# Patient Record
Sex: Male | Born: 1975 | Race: White | Hispanic: No | Marital: Married | Smoking: Former smoker
Health system: Southern US, Community
[De-identification: ages and names within clinical notes are randomized; demographics above are authoritative.]

## PROBLEM LIST (undated history)

## (undated) DIAGNOSIS — K603 Anal fistula, unspecified: Secondary | ICD-10-CM

## (undated) DIAGNOSIS — R197 Diarrhea, unspecified: Secondary | ICD-10-CM

## (undated) DIAGNOSIS — J45909 Unspecified asthma, uncomplicated: Secondary | ICD-10-CM

## (undated) DIAGNOSIS — K6289 Other specified diseases of anus and rectum: Secondary | ICD-10-CM

## (undated) HISTORY — DX: Unspecified asthma, uncomplicated: J45.909

## (undated) HISTORY — PX: WISDOM TOOTH EXTRACTION: SHX21

## (undated) HISTORY — DX: Other specified diseases of anus and rectum: K62.89

---

## 2008-12-29 ENCOUNTER — Emergency Department (HOSPITAL_BASED_OUTPATIENT_CLINIC_OR_DEPARTMENT_OTHER): Admission: EM | Admit: 2008-12-29 | Discharge: 2008-12-29 | Payer: Self-pay | Admitting: Emergency Medicine

## 2008-12-29 ENCOUNTER — Ambulatory Visit: Payer: Self-pay | Admitting: Radiology

## 2011-02-27 ENCOUNTER — Encounter (INDEPENDENT_AMBULATORY_CARE_PROVIDER_SITE_OTHER): Payer: Self-pay | Admitting: General Surgery

## 2011-02-27 ENCOUNTER — Ambulatory Visit (INDEPENDENT_AMBULATORY_CARE_PROVIDER_SITE_OTHER): Payer: Managed Care, Other (non HMO) | Admitting: General Surgery

## 2011-02-27 VITALS — BP 108/76 | HR 58 | Temp 98.0°F | Ht 71.0 in | Wt 180.2 lb

## 2011-02-27 DIAGNOSIS — K612 Anorectal abscess: Secondary | ICD-10-CM

## 2011-02-27 DIAGNOSIS — K610 Anal abscess: Secondary | ICD-10-CM

## 2011-02-27 NOTE — Patient Instructions (Signed)
Expect bloody drainage. Change outer bandage as needed. Tomorrow soaked in and removed gauze packing. Keep wound covered with gauze until drainage stops. Take her antibiotics until they are gone. Call a secure not feeling steadily better.

## 2011-02-27 NOTE — Progress Notes (Signed)
Patient comes to the office with a 3 to four-day history of increasing pain and swelling in his left perianal space. He states he had an abscess in this identical area about 3 years ago [place]. He has had no symptoms between times  Examination. He is afebrile and in no distress. In the left perianal area is an approximately 3 cm area of induration tenderness and probable fluctuance.  Assessment and plan: Perianal abscess  Procedure: Under local anesthesia this area was incised and drained obtaining a moderate amount of purulent material. The cavity was packed with iodoform gauze. He is given a prescription for Cipro 500 mg twice daily for one week and and hydrocodone as needed. Wound care was explained. He will return in 3 weeks to rule out fistula.

## 2011-04-13 ENCOUNTER — Ambulatory Visit (INDEPENDENT_AMBULATORY_CARE_PROVIDER_SITE_OTHER): Payer: Managed Care, Other (non HMO) | Admitting: General Surgery

## 2011-04-13 ENCOUNTER — Encounter (INDEPENDENT_AMBULATORY_CARE_PROVIDER_SITE_OTHER): Payer: Self-pay | Admitting: General Surgery

## 2011-04-13 VITALS — BP 122/78 | HR 70 | Temp 98.6°F | Ht 71.0 in | Wt 180.4 lb

## 2011-04-13 DIAGNOSIS — K603 Anal fistula: Secondary | ICD-10-CM

## 2011-04-13 NOTE — Progress Notes (Signed)
Patient returns to the office with history of perianal abscess 3 years ago drained in [place] and then recurrent abscess that I drained about 6 weeks ago. He was feeling better and he did not return for followup again developed swelling and discomfort several days ago and then some bloody drainage today.  Exam: Pertinent finding is limited and rectal exam. In the left perirectal space is an obvious external fistula opening with some slight purulent drainage and a tract palpable up toward the anus.. There is no fluctuance to indicate abscess.  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 I recommended exam under anesthesia and fistulotomy or placement of a seton depending on the depth of the fistula.This was discussed in detail with the patient and he is in agreement. We discussed that depending on the depth of the tract I would perform fistulotomy or more likely place a seton drain for subsequent fistula plug or LIFT procedure.

## 2011-04-13 NOTE — Patient Instructions (Signed)
Call for questions prior to your procedure

## 2011-04-20 ENCOUNTER — Ambulatory Visit (HOSPITAL_BASED_OUTPATIENT_CLINIC_OR_DEPARTMENT_OTHER)
Admission: RE | Admit: 2011-04-20 | Discharge: 2011-04-20 | Disposition: A | Discharge status: Home / Self Care | Payer: Managed Care, Other (non HMO) | Source: Ambulatory Visit | Attending: General Surgery | Admitting: General Surgery

## 2011-04-20 DIAGNOSIS — K603 Anal fistula, unspecified: Secondary | ICD-10-CM | POA: Insufficient documentation

## 2011-04-20 DIAGNOSIS — Z01812 Encounter for preprocedural laboratory examination: Secondary | ICD-10-CM | POA: Insufficient documentation

## 2011-04-20 LAB — POCT HEMOGLOBIN-HEMACUE: Hemoglobin: 14.2 g/dL (ref 13.0–17.0)

## 2011-04-22 NOTE — Op Note (Signed)
Sean Aguirre, Sean Aguirre NO.:  [id number]  MEDICAL RECORD NO.:  [id number]  LOCATION:                                 FACILITY:  PHYSICIAN:  Lorne Skeens. Ghazal Pevey, M.D.DATE OF BIRTH:  07-Sep-1975  DATE OF PROCEDURE:  04/20/2011 DATE OF DISCHARGE:                              OPERATIVE REPORT   PREOPERATIVE DIAGNOSIS:  Fistula-in-ano.  POSTOPERATIVE DIAGNOSIS:  Fistula-in-ano.  SURGICAL PROCEDURE:  Exam under anesthesia and placement of draining seton.  SURGEON:  Lorne Skeens. Geovana Gebel, MD  ANESTHESIA:  General.  BRIEF HISTORY:  The patient is a 35 year old male with a history of perirectal abscess drained in [place] about 3 years ago.  He developed a recurrent perirectal abscess which I drained in August.  He did not return for scheduled follow up as he was feeling well, but then re- presented couple of weeks ago with persistent intermittent pain and drainage from the left perirectal space.  Examination in office at that time revealed a definite anal fistula with slight purulent drainage.  We placed him on antibiotics with improvement.  I have recommended exam under anesthesia with possible fistulotomy or placement of a seton for subsequent fistula plug.  This has all been discussed with the patient including treatment algorithm, risks of anesthetic complications, bleeding, infection,.  He is now brought to operating room for this procedure.  DESCRIPTION OPERATION:  The patient was brought to operating room, placed in supine position on the operative table and laryngeal mask general anesthesia was induced.  He was carefully positioned in lithotomy position and the perineum sterilely prepped and draped.  The anorectum was carefully examined and there was an obvious external fistula opening about 4 cm from the anal verge in the left perirectal space, possibly just posterior to the midline.  I was able to pass a probe into this opening easily and it passed  directly medially toward the left lateral anal canal up toward the dentate line, but I was unable to easily passed this through an internal opening.  Examination of the dentate line of this area showed a little area of granulation tissue.  I suspect it was the internal fistula opening, but again the probe would not pass easily.  I injected the fistula tract externally with hydrogen peroxide, but this did not percolate into the anus.  I then repassed the probe with careful manipulation at the dentate line immediate in the left lateral position this came up to an area of very thin granulation tissue and some irritation which to me clearly appear to be the internal opening, which was somewhat stenotic and I was able to then pass the probe through this area.  This appeared reasonably deep possibly through a significant portion of the external sphincter and I elected to place a seton for subsequent fistula plug placement.  A blue vessel loop was then passed with the probe through the tract and securely tied into place loosely.  The site was injected with Marcaine with epinephrine.  Dry gauze dressing was applied.  The patient was taken to recovery in good condition.     Lorne Skeens. Taraoluwa Thakur, M.D.     Kaye.Cornish  D:  04/20/2011  T:  04/20/2011  Job:  [id number]  Electronically Signed by Glenna Fellows M.D. on 04/22/2011 10:53:43 AM

## 2011-05-14 ENCOUNTER — Ambulatory Visit (INDEPENDENT_AMBULATORY_CARE_PROVIDER_SITE_OTHER): Payer: Managed Care, Other (non HMO) | Admitting: General Surgery

## 2011-05-14 ENCOUNTER — Encounter (INDEPENDENT_AMBULATORY_CARE_PROVIDER_SITE_OTHER): Payer: Self-pay | Admitting: General Surgery

## 2011-05-14 VITALS — BP 122/78 | HR 60 | Temp 97.7°F | Resp 18 | Ht 71.0 in | Wt 179.2 lb

## 2011-05-14 DIAGNOSIS — Z09 Encounter for follow-up examination after completed treatment for conditions other than malignant neoplasm: Secondary | ICD-10-CM

## 2011-05-14 NOTE — Progress Notes (Signed)
Patient returns for followup post draining seton placement for chronic anal fistula. He's had some mild irritation but no major problems.  On examination the seton was in place with minimal drainage or inflammation.  Patient is doing well following placement of draining seton. He is to return in 6 weeks and we can discuss definitive treatment.

## 2011-06-30 HISTORY — PX: APPENDECTOMY: SHX54

## 2011-07-03 ENCOUNTER — Other Ambulatory Visit (INDEPENDENT_AMBULATORY_CARE_PROVIDER_SITE_OTHER): Payer: Self-pay | Admitting: General Surgery

## 2011-07-03 ENCOUNTER — Encounter (INDEPENDENT_AMBULATORY_CARE_PROVIDER_SITE_OTHER): Payer: Self-pay | Admitting: General Surgery

## 2011-07-03 ENCOUNTER — Ambulatory Visit (INDEPENDENT_AMBULATORY_CARE_PROVIDER_SITE_OTHER): Payer: Managed Care, Other (non HMO) | Admitting: General Surgery

## 2011-07-03 VITALS — BP 112/78 | HR 95 | Temp 97.2°F | Ht 71.0 in | Wt 183.8 lb

## 2011-07-03 DIAGNOSIS — K603 Anal fistula: Secondary | ICD-10-CM

## 2011-07-03 NOTE — Progress Notes (Signed)
Subjective:   Anal fistula  Patient ID: Sean Aguirre, male   DOB: [birth year], 36 y.o.   MRN: [id number]  HPI Patient returns to the office now over 2 months following placement of a draining seton for a chronic anal fistula. He reports he is feeling well with no discomfort and minimal if any drainage. Her Past Medical History  Diagnosis Date  . Asthma   . Rectal pain   . Rectal abscess    Past Surgical History  Procedure Date  . Lasik 2003  . Wisdom tooth extraction   . Treatment fistula anal 04/20/11     Review of Systems  HENT: Negative.   Respiratory: Negative.   Cardiovascular: Negative.   Gastrointestinal: Negative.        Objective:   Physical Exam General: Healthy-appearing Caucasian male Skin: Warm and dry without rash or infection Lungs: Clear without wheezing or breathing Cardiovascular: Regular rate without murmurs Abdomen: Soft nontender Rectal: Sean Aguirre is in place with no particular drainage in a very clean appearing fistula tract and no induration or inflammation.    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 I believe he is ready for definitive procedure. We again discussed options of fistula plug versus LIFT procedure. I think he would be a good candidate for a fistula plug as he has quite a long tract. We discussed the nature of the procedure and recovery and the fact that this works approximately 50-60% of the time. He understands and agrees we'll schedule this for him.    Plan:     Placement of fistula plug as an outpatient

## 2011-07-29 ENCOUNTER — Encounter (HOSPITAL_COMMUNITY): Payer: Self-pay | Admitting: Pharmacy Technician

## 2011-08-06 ENCOUNTER — Encounter (HOSPITAL_COMMUNITY): Payer: Self-pay

## 2011-08-06 ENCOUNTER — Encounter (HOSPITAL_COMMUNITY)
Admission: RE | Admit: 2011-08-06 | Discharge: 2011-08-06 | Disposition: A | Discharge status: Home / Self Care | Payer: Managed Care, Other (non HMO) | Source: Ambulatory Visit | Attending: General Surgery | Admitting: General Surgery

## 2011-08-06 LAB — CBC
HCT: 42.2 % (ref 39.0–52.0)
Hemoglobin: 14.6 g/dL (ref 13.0–17.0)
RDW: 13.9 % (ref 11.5–15.5)
WBC: 5.3 10*3/uL (ref 4.0–10.5)

## 2011-08-06 LAB — SURGICAL PCR SCREEN
MRSA, PCR: NEGATIVE
Staphylococcus aureus: NEGATIVE

## 2011-08-06 NOTE — Patient Instructions (Signed)
20 Sean Aguirre  08/06/2011   Your procedure is scheduled on:  08/12/11  Report to [facility] at 8:45 AM.  Call this number if you have problems the morning of surgery: [phone]   Remember:   Do not eat food:After Midnight.  May have clear liquids: up to 4 Hrs. before arrival.( 6 HRS BEFORE SURGERY)  Clear liquids include soda, tea, black coffee, apple or grape juice, broth.  Take these medicines the morning of surgery with A SIP OF WATER:    Do not wear jewelry, make-up or nail polish.  Do not wear lotions, powders, or perfumes.   Do not shave underarms or legs 48 hours prior to surgery (men may shave face)  Do not bring valuables to the hospital.  Contacts, dentures or bridgework may not be worn into surgery.  Leave suitcase in the car. After surgery it may be brought to your room.  For patients admitted to the hospital, checkout time is 11:00 AM the day of discharge.   Patients discharged the day of surgery will not be allowed to drive home.  Name and phone number of your driver:   Special Instructions: CHG Shower Use Special Wash: 1/2 bottle night before surgery and 1/2 bottle morning of surgery.   Please read over the following fact sheets that you were given: MRSA Information  20 Sean Aguirre  08/06/2011

## 2011-08-12 ENCOUNTER — Encounter (HOSPITAL_COMMUNITY): Payer: Self-pay | Admitting: Anesthesiology

## 2011-08-12 ENCOUNTER — Encounter (HOSPITAL_COMMUNITY)
Admission: RE | Disposition: A | Discharge status: Home / Self Care | Payer: Self-pay | Source: Ambulatory Visit | Attending: General Surgery

## 2011-08-12 ENCOUNTER — Encounter (HOSPITAL_COMMUNITY): Payer: Self-pay | Admitting: *Deleted

## 2011-08-12 ENCOUNTER — Ambulatory Visit (HOSPITAL_COMMUNITY): Payer: Managed Care, Other (non HMO) | Admitting: Anesthesiology

## 2011-08-12 ENCOUNTER — Ambulatory Visit (HOSPITAL_COMMUNITY)
Admission: RE | Admit: 2011-08-12 | Discharge: 2011-08-12 | Disposition: A | Discharge status: Home / Self Care | Payer: Managed Care, Other (non HMO) | Source: Ambulatory Visit | Attending: General Surgery | Admitting: General Surgery

## 2011-08-12 DIAGNOSIS — K603 Anal fistula, unspecified: Secondary | ICD-10-CM | POA: Insufficient documentation

## 2011-08-12 DIAGNOSIS — Z01812 Encounter for preprocedural laboratory examination: Secondary | ICD-10-CM | POA: Insufficient documentation

## 2011-08-12 HISTORY — PX: FISTULA PLUG: SHX5831

## 2011-08-12 SURGERY — CLOSURE, FISTULA, ANUS, USING PLUG
Anesthesia: General | Site: Anus | Wound class: Clean Contaminated

## 2011-08-12 MED ORDER — ACETAMINOPHEN 10 MG/ML IV SOLN
INTRAVENOUS | Status: DC | PRN
  Administered 2011-08-12: 1000 mg via INTRAVENOUS

## 2011-08-12 MED ORDER — MIDAZOLAM HCL 5 MG/5ML IJ SOLN
INTRAMUSCULAR | Status: DC | PRN
  Administered 2011-08-12: 2 mg via INTRAVENOUS

## 2011-08-12 MED ORDER — LIDOCAINE HCL (CARDIAC) 20 MG/ML IV SOLN
INTRAVENOUS | Status: DC | PRN
  Administered 2011-08-12: 60 mg via INTRAVENOUS

## 2011-08-12 MED ORDER — LACTATED RINGERS IV SOLN
INTRAVENOUS | Status: DC | PRN
  Administered 2011-08-12 (×2): via INTRAVENOUS

## 2011-08-12 MED ORDER — ACETAMINOPHEN 10 MG/ML IV SOLN
INTRAVENOUS | Status: AC
  Filled 2011-08-12: qty 100

## 2011-08-12 MED ORDER — HYDROGEN PEROXIDE 3 % EX SOLN
CUTANEOUS | Status: DC | PRN
  Administered 2011-08-12: 1

## 2011-08-12 MED ORDER — FENTANYL CITRATE 0.05 MG/ML IJ SOLN
INTRAMUSCULAR | Status: DC | PRN
  Administered 2011-08-12: 25 ug via INTRAVENOUS
  Administered 2011-08-12: 50 ug via INTRAVENOUS
  Administered 2011-08-12: 25 ug via INTRAVENOUS

## 2011-08-12 MED ORDER — CIPROFLOXACIN IN D5W 400 MG/200ML IV SOLN
INTRAVENOUS | Status: AC
  Filled 2011-08-12: qty 200

## 2011-08-12 MED ORDER — LACTATED RINGERS IV SOLN: INTRAVENOUS | Status: DC

## 2011-08-12 MED ORDER — BUPIVACAINE LIPOSOME 1.3 % IJ SUSP
20.0000 mL | INTRAMUSCULAR | Status: AC
  Administered 2011-08-12: 20 mL
  Filled 2011-08-12: qty 20

## 2011-08-12 MED ORDER — PROMETHAZINE HCL 25 MG/ML IJ SOLN: 6.2500 mg | INTRAMUSCULAR | Status: DC | PRN

## 2011-08-12 MED ORDER — FENTANYL CITRATE 0.05 MG/ML IJ SOLN: 25.0000 ug | INTRAMUSCULAR | Status: DC | PRN

## 2011-08-12 MED ORDER — CIPROFLOXACIN IN D5W 400 MG/200ML IV SOLN
400.0000 mg | INTRAVENOUS | Status: AC
  Administered 2011-08-12: 400 mg via INTRAVENOUS

## 2011-08-12 MED ORDER — OXYCODONE-ACETAMINOPHEN 5-325 MG PO TABS: 1.0000 | ORAL_TABLET | ORAL | Status: AC | PRN

## 2011-08-12 MED ORDER — PROPOFOL 10 MG/ML IV EMUL
INTRAVENOUS | Status: DC | PRN
  Administered 2011-08-12: 200 mg via INTRAVENOUS

## 2011-08-12 MED ORDER — 0.9 % SODIUM CHLORIDE (POUR BTL) OPTIME
TOPICAL | Status: DC | PRN
  Administered 2011-08-12: 1000 mL

## 2011-08-12 MED ORDER — ONDANSETRON HCL 4 MG/2ML IJ SOLN
INTRAMUSCULAR | Status: DC | PRN
  Administered 2011-08-12: 4 mg via INTRAVENOUS

## 2011-08-12 SURGICAL SUPPLY — 37 items
BLADE HEX COATED 2.75 (ELECTRODE) ×3 IMPLANT
BLADE SURG 15 STRL LF DISP TIS (BLADE) ×2 IMPLANT
BLADE SURG 15 STRL SS (BLADE) ×1
CLOTH BEACON ORANGE TIMEOUT ST (SAFETY) ×3 IMPLANT
DECANTER SPIKE VIAL GLASS SM (MISCELLANEOUS) ×3 IMPLANT
DRAIN PENROSE 18X1/2 LTX STRL (DRAIN) ×3 IMPLANT
DRAPE LG THREE QUARTER DISP (DRAPES) ×3 IMPLANT
DRSG PAD ABDOMINAL 8X10 ST (GAUZE/BANDAGES/DRESSINGS) ×3 IMPLANT
ELECT REM PT RETURN 9FT ADLT (ELECTROSURGICAL) ×3
ELECTRODE REM PT RTRN 9FT ADLT (ELECTROSURGICAL) ×2 IMPLANT
GAUZE SPONGE 4X4 16PLY XRAY LF (GAUZE/BANDAGES/DRESSINGS) ×3 IMPLANT
GLOVE BIOGEL PI IND STRL 7.0 (GLOVE) ×2 IMPLANT
GLOVE BIOGEL PI INDICATOR 7.0 (GLOVE) ×1
GOWN STRL NON-REIN LRG LVL3 (GOWN DISPOSABLE) ×3 IMPLANT
GOWN STRL REIN XL XLG (GOWN DISPOSABLE) ×6 IMPLANT
KIT ANAL FISTULA (Mesh General) ×3 IMPLANT
KIT BASIN OR (CUSTOM PROCEDURE TRAY) ×3 IMPLANT
LOOP MINI RED (MISCELLANEOUS) IMPLANT
LOOP VESSEL MAXI BLUE (MISCELLANEOUS) IMPLANT
LUBRICANT JELLY ST 5GR 8946 (MISCELLANEOUS) IMPLANT
NDL SAFETY ECLIPSE 18X1.5 (NEEDLE) ×2 IMPLANT
NEEDLE HYPO 18GX1.5 SHARP (NEEDLE) ×1
NEEDLE HYPO 25X1 1.5 SAFETY (NEEDLE) ×3 IMPLANT
NS IRRIG 1000ML POUR BTL (IV SOLUTION) ×3 IMPLANT
PACK LITHOTOMY IV (CUSTOM PROCEDURE TRAY) ×3 IMPLANT
PENCIL BUTTON HOLSTER BLD 10FT (ELECTRODE) ×3 IMPLANT
SPONGE GAUZE 4X4 12PLY (GAUZE/BANDAGES/DRESSINGS) ×3 IMPLANT
SPONGE SURGIFOAM ABS GEL 12-7 (HEMOSTASIS) ×3 IMPLANT
SUT CHROMIC 2 0 SH (SUTURE) IMPLANT
SUT CHROMIC 3 0 SH 27 (SUTURE) IMPLANT
SUT SILK 2 0 SH (SUTURE) ×3 IMPLANT
SUT VIC AB 2-0 UR6 27 (SUTURE) ×3 IMPLANT
SYR CONTROL 10ML LL (SYRINGE) ×3 IMPLANT
TAPE CLOTH SURG 4X10 WHT LF (GAUZE/BANDAGES/DRESSINGS) ×3 IMPLANT
TOWEL OR 17X26 10 PK STRL BLUE (TOWEL DISPOSABLE) ×3 IMPLANT
UNDERPAD 30X30 INCONTINENT (UNDERPADS AND DIAPERS) ×3 IMPLANT
YANKAUER SUCT BULB TIP 10FT TU (MISCELLANEOUS) ×3 IMPLANT

## 2011-08-12 NOTE — Op Note (Signed)
Preoperative Diagnosis: Anal fistula  Postoprative Diagnosis: Anal fistula  Procedure: Procedure(s): FISTULA PLUG   Surgeon: Glenna Fellows T   Assistants: none  Anesthesia:  General LMA anesthesiaDiagnos  Indications:  Patient is a 36 year old male who previously presented with a chronic fistula in anal in the left posterior position. I had previously placed a draining seton several months ago and the acute inflammation has resolved. We have discussed options for surgical treatment and have elected to proceed with placement of a fistula plug. We discussed the nature of the procedure including indications, and risks of bleeding, infection, failure of the plug at about 50% and anesthetic complications. He agrees and is brought to the operating room for this procedure.   Procedure Detail: Patient is brought to the operating room, placed in the supine position on the operating table laryngeal mask and general anesthesia induced. He was carefully positioned in lithotomy position and the perineum sterilely prepped and draped. Time out was performed and correct patient and procedure verified. An anal retractor was placed and the seton was noted to be in position at about the 4:00 position posteriorly. The rubber seton was cut and tied to a 0 silk suture which was brought through the fistula tract. The debridement brush was tied to the silk and then drawn easily through the fistula track back and forth several times for debridement. The tract was then flushed with hydrogen peroxide. The fistula plug was hydrated with saline then tied to the debridement brush and brought through the external opening the narrow end first up through the internal opening until it was snugly in place. A 2-0 Vicryl suture was then used as a mattress suture incorporating the internal sphincter at the internal opening and passing through the plug utilizing 2 sutures at 90 angles. The plug was then trimmed and the sutures  tied which completely close the internal opening and secured and dunked the plug just beneath the mucosa. The external portion of the plug was then trimmed at skin level and the external opening dilated slightly with the hemostat to allow adequate drainage. A perianal block of Exparel local anesthetic was used. Sponge needle counts correct. Clean gauze dressing was placed.   Findings: As above  Estimated Blood Loss:  Minimal         Drains: none  Blood Given: none          Specimens: none        Complications:  * No complications entered in OR log *         Disposition: PACU - hemodynamically stable.         Condition: stable  Mariella Saa MD, FACS  08/12/2011, 11:44 AM

## 2011-08-12 NOTE — Discharge Instructions (Signed)
Avoid strenuous activity or lifting until seen in the office. Expect some drainage.  A sitz bath or shower as needed.  [facility] _______[facility]  RECTAL SURGERY POST OP INSTRUCTIONS: POST OP INSTRUCTIONS  Always review your discharge instruction sheet given to you by the facility where your surgery was performed. IF YOU HAVE DISABILITY OR FAMILY LEAVE FORMS, YOU MUST BRING THEM TO THE OFFICE FOR PROCESSING.   DO NOT GIVE THEM TO YOUR DOCTOR.  1. A  prescription for pain medication may be given to you upon discharge.  Take your pain medication as prescribed, if needed.  If narcotic pain medicine is not needed, then you may take acetaminophen (Tylenol) or ibuprofen (Advil) as needed. 2. Take your usually prescribed medications unless otherwise directed. 3. If you need a refill on your pain medication, please contact your pharmacy.  They will contact our office to request authorization. Prescriptions will not be filled after 5 pm or on week-ends. 4. You should follow a light diet the first 48 hours after arrival home, such as soup and crackers, etc.  Be sure to include lots of fluids daily.  Resume your normal diet 2-3 days after surgery.. 5. Most patients will experience some swelling and discomfort in the rectal area. Ice packs, reclining and warm tub soaks will help.  Swelling and discomfort can take several days to resolve.  6. It is common to experience some constipation if taking pain medication after surgery.  Increasing fluid intake and taking a stool softener (such as Colace) will usually help or prevent this problem from occurring.  A mild laxative (Milk of Magnesia or Miralax) should be taken according to package directions if there are no bowel movements after 48 hours. 7. Unless discharge instructions indicate otherwise, leave your bandage dry and in place for 24 hours, or remove the bandage if you have a bowel movement. You may notice a small amount of bleeding with bowel  movements for the first few days. You may have some packing in the rectum which will come out over the first day or two. You will need to wear an absorbent pad or soft cotton gauze in your underwear until the drainage stops.it. 8. ACTIVITIES:  You may resume regular (light) daily activities beginning the next day--such as daily self-care, walking, climbing stairs--gradually increasing activities as tolerated.  You may have sexual intercourse when it is comfortable.  Refrain from any heavy lifting or straining until approved by your doctor. a. You may drive when you are no longer taking prescription pain medication, you can comfortably wear a seatbelt, and you can safely maneuver your car and apply brakes. b. RETURN TO WORK: : ____________________ c.  9. You should see your doctor in the office for a follow-up appointment approximately 2-3 weeks after your surgery.  Make sure that you call for this appointment within a day or two after you arrive home to insure a convenient appointment time. 10. OTHER INSTRUCTIONS:  __________________________________________________________________________________________________________________________________________________________________________________________  WHEN TO CALL YOUR DOCTOR: 1. Fever over 101.0 2. Inability to urinate 3. Nausea and/or vomiting 4. Extreme swelling or bruising 5. Continued bleeding from rectum. 6. Increased pain, redness, or drainage from the incision 7. Constipation  The clinic staff is available to answer your questions during regular business hours.  Please don't hesitate to call and ask to speak to one of the nurses for clinical concerns.  If you have a medical emergency, go to the nearest emergency room or call 911.  A surgeon from  [facility] is always on call at the hospital   [address], [place], [place]  [address] ?  [address] [address], [place], [place]   [address] [phone] ? [phone] ?  FAX [phone] Web site: [email or web address]

## 2011-08-12 NOTE — Preoperative (Signed)
Beta Blockers   Reason not to administer Beta Blockers:Not Applicable 

## 2011-08-12 NOTE — Anesthesia Preprocedure Evaluation (Addendum)
Anesthesia Evaluation  Patient identified by MRN, date of birth, ID band Patient awake    Reviewed: Allergy & Precautions, H&P , NPO status , Patient's Chart, lab work & pertinent test results  History of Anesthesia Complications (+) Emergence Delirium  Airway Mallampati: II TM Distance: >3 FB Neck ROM: full    Dental No notable dental hx. (+) Teeth Intact and Chipped   Pulmonary neg pulmonary ROS, asthma ,  clear to auscultation  Pulmonary exam normal       Cardiovascular Exercise Tolerance: Good neg cardio ROS regular Normal    Neuro/Psych Negative Neurological ROS  Negative Psych ROS   GI/Hepatic negative GI ROS, Neg liver ROS,   Endo/Other  Negative Endocrine ROS  Renal/GU negative Renal ROS  Genitourinary negative   Musculoskeletal   Abdominal   Peds  Hematology negative hematology ROS (+)   Anesthesia Other Findings Chip upper left incisor  Reproductive/Obstetrics negative OB ROS                           Anesthesia Physical Anesthesia Plan  ASA: I  Anesthesia Plan: General   Post-op Pain Management:    Induction:   Airway Management Planned:   Additional Equipment:   Intra-op Plan:   Post-operative Plan:   Informed Consent: I have reviewed the patients History and Physical, chart, labs and discussed the procedure including the risks, benefits and alternatives for the proposed anesthesia with the patient or authorized representative who has indicated his/her understanding and acceptance.   Dental Advisory Given  Plan Discussed with: CRNA  Anesthesia Plan Comments:         Anesthesia Quick Evaluation

## 2011-08-12 NOTE — Transfer of Care (Signed)
Immediate Anesthesia Transfer of Care Note  Patient: Sean Aguirre  Procedure(s) Performed: Procedure(s) (LRB): FISTULA PLUG ()  Patient Location: PACU  Anesthesia Type: General  Level of Consciousness: awake, alert , oriented and patient cooperative  Airway & Oxygen Therapy: Patient Spontanous Breathing and Patient connected to face mask oxygen  Post-op Assessment: Report given to PACU RN, Post -op Vital signs reviewed and stable and Patient moving all extremities X 4  Post vital signs: Reviewed and stable  Complications: No apparent anesthesia complications

## 2011-08-12 NOTE — Anesthesia Postprocedure Evaluation (Signed)
Anesthesia Post Note  Patient: Sean Aguirre  Procedure(s) Performed: Procedure(s) (LRB): FISTULA PLUG ()  Anesthesia type: General  Patient location: PACU  Post pain: Pain level controlled  Post assessment: Post-op Vital signs reviewed  Last Vitals:  Filed Vitals:   08/12/11 1200  BP: 105/75  Pulse:   Temp: 36.3 C  Resp:     Post vital signs: Reviewed  Level of consciousness: sedated  Complications: No apparent anesthesia complications

## 2011-08-12 NOTE — H&P (Signed)
  Subjective:     Sean Aguirre is a 36 y.o. male who presents for anal fistula plug for chronic anal fistula S/P draining Seton    Objective:    BP 108/66  Pulse 76  Temp(Src) 97.2 F (36.2 C) (Oral)  Resp 18  SpO2 99%  General:  alert  Skin:  normal and no rash or abnormalities  Eyes: Neg  Mouth: MMM no lesions  Lymph Nodes:  Cervical, supraclavicular, and axillary nodes normal.  Lungs:  clear to auscultation bilaterally  Heart:  regular rate and rhythm, S1, S2 normal, no murmur, click, rub or gallop  Abdomen: soft, non-tender; bowel sounds normal; no masses,  no organomegaly  CVA:  absent  Genitourinary: defer exam  Extremities:  extremities normal, atraumatic, no cyanosis or edema  Neurologic:  negative  Psychiatric:  normal mood, behavior, speech, dress, and thought processes     Assessment:    Chronic anal fistula    Plan:    1. Discussed the risk of surgery including infection and persistent fistula,  and the risks of general anesthetic including MI, CVA, sudden death or even reaction to anesthetic medications. The patient understands the risks, any and all questions were answered to the patient's satisfaction.   Date of Surgery Update  Mariella Saa MD, FACS  08/12/2011, 10:34 AM

## 2011-08-25 ENCOUNTER — Telehealth (INDEPENDENT_AMBULATORY_CARE_PROVIDER_SITE_OTHER): Payer: Self-pay | Admitting: General Surgery

## 2011-08-25 NOTE — Telephone Encounter (Signed)
Patient aware of appt

## 2011-08-25 NOTE — Telephone Encounter (Signed)
Patient called two weeks status post fistula plug complaining of increased swelling and drainage at rectum and to the side on patient's buttock. Skin is red. He is moving his bowels once a day. Not a lot of pain. Soaking in tub twice a day. Feels a lot of pressure. Dr Johna Sheriff paged. Ok to bring into office tomorrow to see Dr Johna Sheriff. Left message for patient to call back and ask for me. Appt made for tomorrow.

## 2011-08-26 ENCOUNTER — Encounter (INDEPENDENT_AMBULATORY_CARE_PROVIDER_SITE_OTHER): Payer: Self-pay | Admitting: General Surgery

## 2011-08-26 ENCOUNTER — Ambulatory Visit (INDEPENDENT_AMBULATORY_CARE_PROVIDER_SITE_OTHER): Payer: Managed Care, Other (non HMO) | Admitting: General Surgery

## 2011-08-26 VITALS — BP 120/70 | HR 66 | Temp 98.0°F | Resp 18 | Ht 71.0 in | Wt 183.0 lb

## 2011-08-26 DIAGNOSIS — K603 Anal fistula: Secondary | ICD-10-CM

## 2011-08-26 MED ORDER — CIPROFLOXACIN HCL 500 MG PO TABS: 500.0000 mg | ORAL_TABLET | Freq: Two times a day (BID) | ORAL | Status: AC

## 2011-08-26 NOTE — Progress Notes (Signed)
Patient is 2 weeks following anal fistula plug placement for chronic anal fistula. He had been getting along okay with expected mild discomfort and a little intermittent drainage but over the past about 3 or 4 days has had increasing pressure and discomfort and then a bump developed at the external site  On examination there appears to be a small superficial abscess at the external opening where it appears that the skin has nearly grown over the external opening. There is no significant tenderness up along the fistula tract toward the anus was just a little bit of swelling here.  It appears that the external opening has closed a little early and after explaining the procedure under local anesthesia I excised the skin over the external opening area there was a tiny bit of cloudy fluid. I was able to pass a hemostat up through the external opening along the tract it feels that the plug is in place although I cannot see it and there was no pus or deeper abscess. I would put him on Cipro for 5 days. He'll call if he is not feeling gradually better and he will have an appointment in 2 weeks.

## 2011-08-28 ENCOUNTER — Encounter (INDEPENDENT_AMBULATORY_CARE_PROVIDER_SITE_OTHER): Payer: Managed Care, Other (non HMO) | Admitting: General Surgery

## 2011-09-16 ENCOUNTER — Telehealth (INDEPENDENT_AMBULATORY_CARE_PROVIDER_SITE_OTHER): Payer: Self-pay | Admitting: General Surgery

## 2011-09-18 ENCOUNTER — Encounter (INDEPENDENT_AMBULATORY_CARE_PROVIDER_SITE_OTHER): Payer: Managed Care, Other (non HMO) | Admitting: General Surgery

## 2011-09-24 ENCOUNTER — Telehealth (INDEPENDENT_AMBULATORY_CARE_PROVIDER_SITE_OTHER): Payer: Self-pay

## 2011-09-24 NOTE — Telephone Encounter (Signed)
Left message with follow up appointment date & time 10/09/11 @ 9:10 w/Dr. Johna Sheriff

## 2011-10-09 ENCOUNTER — Ambulatory Visit (INDEPENDENT_AMBULATORY_CARE_PROVIDER_SITE_OTHER): Payer: Managed Care, Other (non HMO) | Admitting: General Surgery

## 2011-10-09 ENCOUNTER — Encounter (INDEPENDENT_AMBULATORY_CARE_PROVIDER_SITE_OTHER): Payer: Self-pay | Admitting: General Surgery

## 2011-10-09 VITALS — BP 116/74 | HR 70 | Temp 97.2°F | Resp 18 | Ht 71.5 in | Wt 184.2 lb

## 2011-10-09 DIAGNOSIS — K603 Anal fistula, unspecified: Secondary | ICD-10-CM

## 2011-10-09 NOTE — Progress Notes (Signed)
History: Patient returns now 2 months following fistula plug for chronic anal fistula. He is feeling gradually better. As just an occasional minimal twinge of discomfort. The drainage has been getting progressively less and less frequent.  On examination there was a tiny blister at the external opening which opened with an 18-gauge needle and got a minimal drop of purulence but could not express any more and there is no inflammation around the tract.  Assessment and plan: It appears to be progressive healing at this point after his fistula plug. He will return in 2 months for followup

## 2011-11-26 ENCOUNTER — Ambulatory Visit (INDEPENDENT_AMBULATORY_CARE_PROVIDER_SITE_OTHER): Payer: Managed Care, Other (non HMO) | Admitting: General Surgery

## 2011-11-26 ENCOUNTER — Encounter (INDEPENDENT_AMBULATORY_CARE_PROVIDER_SITE_OTHER): Payer: Self-pay | Admitting: General Surgery

## 2011-11-26 VITALS — BP 106/72 | HR 78 | Temp 97.9°F | Resp 14 | Ht 71.0 in | Wt 185.8 lb

## 2011-11-26 DIAGNOSIS — Z09 Encounter for follow-up examination after completed treatment for conditions other than malignant neoplasm: Secondary | ICD-10-CM

## 2011-11-26 NOTE — Progress Notes (Signed)
Chief complaint: Followup fistula plug  The patient returns now 3 months following placement of fistula plug for chronic anal fistula. He continues to gradually feel better. He has had a couple episodes of slight drainage where he felt a little blister at the exit site but most days has no drainage and has not had any discomfort.  Exam today shows the external site with some induration but I cannot really see an opening or express any discharge. There is no inflammation in the area is nontender.  Assessment and plan. The area continues to show gradual improvement and healing. I asked him to return in 3 months

## 2012-02-18 ENCOUNTER — Telehealth (INDEPENDENT_AMBULATORY_CARE_PROVIDER_SITE_OTHER): Payer: Self-pay

## 2012-02-18 NOTE — Telephone Encounter (Signed)
Called patient and left message to call us back. His appointment is being cancelled and he needs to reschedule due to [place] being in surgery tomorrow.

## 2012-02-19 ENCOUNTER — Encounter (INDEPENDENT_AMBULATORY_CARE_PROVIDER_SITE_OTHER): Payer: Managed Care, Other (non HMO) | Admitting: General Surgery

## 2012-03-15 ENCOUNTER — Emergency Department (HOSPITAL_COMMUNITY): Payer: Managed Care, Other (non HMO) | Admitting: Anesthesiology

## 2012-03-15 ENCOUNTER — Encounter (HOSPITAL_COMMUNITY)
Admission: EM | Disposition: A | Discharge status: Home / Self Care | Payer: Self-pay | Source: Home / Self Care | Attending: Emergency Medicine

## 2012-03-15 ENCOUNTER — Encounter (HOSPITAL_BASED_OUTPATIENT_CLINIC_OR_DEPARTMENT_OTHER): Payer: Self-pay | Admitting: *Deleted

## 2012-03-15 ENCOUNTER — Encounter (HOSPITAL_COMMUNITY): Payer: Self-pay | Admitting: Anesthesiology

## 2012-03-15 ENCOUNTER — Observation Stay (HOSPITAL_BASED_OUTPATIENT_CLINIC_OR_DEPARTMENT_OTHER)
Admission: EM | Admit: 2012-03-15 | Discharge: 2012-03-16 | Disposition: A | Discharge status: Home / Self Care | Payer: Managed Care, Other (non HMO) | Attending: General Surgery | Admitting: General Surgery

## 2012-03-15 ENCOUNTER — Emergency Department (HOSPITAL_BASED_OUTPATIENT_CLINIC_OR_DEPARTMENT_OTHER): Payer: Managed Care, Other (non HMO)

## 2012-03-15 DIAGNOSIS — K603 Anal fistula, unspecified: Secondary | ICD-10-CM | POA: Insufficient documentation

## 2012-03-15 DIAGNOSIS — K3580 Unspecified acute appendicitis: Secondary | ICD-10-CM

## 2012-03-15 HISTORY — PX: LAPAROSCOPIC APPENDECTOMY: SHX408

## 2012-03-15 LAB — CBC WITH DIFFERENTIAL/PLATELET
Basophils Relative: 0 % (ref 0–1)
Eosinophils Absolute: 0 10*3/uL (ref 0.0–0.7)
Eosinophils Relative: 0 % (ref 0–5)
Hemoglobin: 14.1 g/dL (ref 13.0–17.0)
Lymphs Abs: 1.1 10*3/uL (ref 0.7–4.0)
MCH: 27.1 pg (ref 26.0–34.0)
MCHC: 34.3 g/dL (ref 30.0–36.0)
MCV: 79 fL (ref 78.0–100.0)
Monocytes Absolute: 1.4 10*3/uL — ABNORMAL HIGH (ref 0.1–1.0)
Monocytes Relative: 9 % (ref 3–12)
Neutrophils Relative %: 83 % — ABNORMAL HIGH (ref 43–77)

## 2012-03-15 LAB — COMPREHENSIVE METABOLIC PANEL
Albumin: 4.4 g/dL (ref 3.5–5.2)
BUN: 12 mg/dL (ref 6–23)
Calcium: 9.6 mg/dL (ref 8.4–10.5)
Creatinine, Ser: 0.9 mg/dL (ref 0.50–1.35)
GFR calc Af Amer: >90 mL/min (ref >90)
Glucose, Bld: 107 mg/dL — ABNORMAL HIGH (ref 70–99)
Potassium: 4.1 mEq/L (ref 3.5–5.1)
Total Protein: 7.6 g/dL (ref 6.0–8.3)

## 2012-03-15 LAB — LIPASE, BLOOD: Lipase: 26 U/L (ref 11–59)

## 2012-03-15 LAB — URINALYSIS, ROUTINE W REFLEX MICROSCOPIC
Bilirubin Urine: NEGATIVE
Ketones, ur: 15 mg/dL — AB
Nitrite: NEGATIVE
Protein, ur: NEGATIVE mg/dL
Urobilinogen, UA: 0.2 mg/dL (ref 0.0–1.0)

## 2012-03-15 SURGERY — APPENDECTOMY, LAPAROSCOPIC
Anesthesia: General | Site: Abdomen | Wound class: Contaminated

## 2012-03-15 MED ORDER — CIPROFLOXACIN IN D5W 400 MG/200ML IV SOLN
400.0000 mg | Freq: Two times a day (BID) | INTRAVENOUS | Status: AC
  Administered 2012-03-15: 400 mg via INTRAVENOUS
  Filled 2012-03-15: qty 200

## 2012-03-15 MED ORDER — ACETAMINOPHEN 10 MG/ML IV SOLN
INTRAVENOUS | Status: AC
  Filled 2012-03-15: qty 100

## 2012-03-15 MED ORDER — IBUPROFEN 600 MG PO TABS
600.0000 mg | ORAL_TABLET | Freq: Four times a day (QID) | ORAL | Status: DC | PRN
  Administered 2012-03-16: 600 mg via ORAL
  Filled 2012-03-15: qty 1

## 2012-03-15 MED ORDER — SODIUM CHLORIDE 0.9 % IV SOLN
INTRAVENOUS | Status: AC
  Filled 2012-03-15: qty 1

## 2012-03-15 MED ORDER — FENTANYL CITRATE 0.05 MG/ML IJ SOLN
INTRAMUSCULAR | Status: DC | PRN
  Administered 2012-03-15: 50 ug via INTRAVENOUS
  Administered 2012-03-15: 100 ug via INTRAVENOUS
  Administered 2012-03-15: 50 ug via INTRAVENOUS

## 2012-03-15 MED ORDER — MIDAZOLAM HCL 5 MG/5ML IJ SOLN
INTRAMUSCULAR | Status: DC | PRN
  Administered 2012-03-15: 2 mg via INTRAVENOUS

## 2012-03-15 MED ORDER — HYDROMORPHONE HCL PF 1 MG/ML IJ SOLN
1.0000 mg | INTRAMUSCULAR | Status: DC | PRN
  Administered 2012-03-15 (×2): 1 mg via INTRAVENOUS
  Filled 2012-03-15 (×2): qty 1

## 2012-03-15 MED ORDER — LACTATED RINGERS IV SOLN
INTRAVENOUS | Status: DC
  Administered 2012-03-15 (×2): via INTRAVENOUS

## 2012-03-15 MED ORDER — LIDOCAINE HCL (CARDIAC) 20 MG/ML IV SOLN
INTRAVENOUS | Status: DC | PRN
  Administered 2012-03-15: 100 mg via INTRAVENOUS

## 2012-03-15 MED ORDER — NEOSTIGMINE METHYLSULFATE 1 MG/ML IJ SOLN
INTRAMUSCULAR | Status: DC | PRN
  Administered 2012-03-15: 4 mg via INTRAVENOUS

## 2012-03-15 MED ORDER — ROCURONIUM BROMIDE 100 MG/10ML IV SOLN
INTRAVENOUS | Status: DC | PRN
  Administered 2012-03-15: 10 mg via INTRAVENOUS
  Administered 2012-03-15: 25 mg via INTRAVENOUS

## 2012-03-15 MED ORDER — POTASSIUM CHLORIDE IN NACL 20-0.9 MEQ/L-% IV SOLN
INTRAVENOUS | Status: DC
  Administered 2012-03-15 – 2012-03-16 (×2): via INTRAVENOUS
  Filled 2012-03-15 (×3): qty 1000

## 2012-03-15 MED ORDER — PROPOFOL 10 MG/ML IV BOLUS
INTRAVENOUS | Status: DC | PRN
  Administered 2012-03-15: 180 mg via INTRAVENOUS

## 2012-03-15 MED ORDER — BUPIVACAINE HCL (PF) 0.5 % IJ SOLN
INTRAMUSCULAR | Status: DC | PRN
  Administered 2012-03-15: 20 mL

## 2012-03-15 MED ORDER — MORPHINE SULFATE 4 MG/ML IJ SOLN
4.0000 mg | Freq: Once | INTRAMUSCULAR | Status: AC
  Administered 2012-03-15: 4 mg via INTRAVENOUS
  Filled 2012-03-15: qty 1

## 2012-03-15 MED ORDER — ONDANSETRON HCL 4 MG PO TABS: 4.0000 mg | ORAL_TABLET | Freq: Four times a day (QID) | ORAL | Status: DC | PRN

## 2012-03-15 MED ORDER — SODIUM CHLORIDE 0.9 % IV BOLUS (SEPSIS)
1000.0000 mL | Freq: Once | INTRAVENOUS | Status: AC
  Administered 2012-03-15: 1000 mL via INTRAVENOUS

## 2012-03-15 MED ORDER — BUPIVACAINE HCL (PF) 0.5 % IJ SOLN
INTRAMUSCULAR | Status: AC
  Filled 2012-03-15: qty 30

## 2012-03-15 MED ORDER — LACTATED RINGERS IV SOLN
INTRAVENOUS | Status: DC | PRN
  Administered 2012-03-15: 2000 mL

## 2012-03-15 MED ORDER — HYDROCODONE-ACETAMINOPHEN 5-325 MG PO TABS
1.0000 | ORAL_TABLET | ORAL | Status: DC | PRN
  Administered 2012-03-16: 1 via ORAL
  Filled 2012-03-15: qty 1

## 2012-03-15 MED ORDER — GLYCOPYRROLATE 0.2 MG/ML IJ SOLN
INTRAMUSCULAR | Status: DC | PRN
  Administered 2012-03-15: 0.6 mg via INTRAVENOUS

## 2012-03-15 MED ORDER — FENTANYL CITRATE 0.05 MG/ML IJ SOLN
INTRAMUSCULAR | Status: AC
  Filled 2012-03-15: qty 2

## 2012-03-15 MED ORDER — FENTANYL CITRATE 0.05 MG/ML IJ SOLN
25.0000 ug | INTRAMUSCULAR | Status: DC | PRN
  Administered 2012-03-15 (×2): 25 ug via INTRAVENOUS

## 2012-03-15 MED ORDER — ENOXAPARIN SODIUM 40 MG/0.4ML ~~LOC~~ SOLN
40.0000 mg | SUBCUTANEOUS | Status: DC
  Filled 2012-03-15: qty 0.4

## 2012-03-15 MED ORDER — SODIUM CHLORIDE 0.9 % IV SOLN
1.0000 g | Freq: Once | INTRAVENOUS | Status: AC
  Administered 2012-03-15: 1 g via INTRAVENOUS
  Filled 2012-03-15: qty 1

## 2012-03-15 MED ORDER — SUCCINYLCHOLINE CHLORIDE 20 MG/ML IJ SOLN
INTRAMUSCULAR | Status: DC | PRN
  Administered 2012-03-15: 100 mg via INTRAVENOUS

## 2012-03-15 MED ORDER — ONDANSETRON HCL 4 MG/2ML IJ SOLN
4.0000 mg | Freq: Once | INTRAMUSCULAR | Status: AC
  Administered 2012-03-15: 4 mg via INTRAVENOUS
  Filled 2012-03-15: qty 2

## 2012-03-15 MED ORDER — DIPHENHYDRAMINE HCL 50 MG/ML IJ SOLN
INTRAMUSCULAR | Status: AC
  Filled 2012-03-15: qty 1

## 2012-03-15 MED ORDER — ONDANSETRON HCL 4 MG/2ML IJ SOLN: 4.0000 mg | Freq: Four times a day (QID) | INTRAMUSCULAR | Status: DC | PRN

## 2012-03-15 MED ORDER — DIPHENHYDRAMINE HCL 50 MG/ML IJ SOLN
25.0000 mg | Freq: Once | INTRAMUSCULAR | Status: AC
  Administered 2012-03-15: 25 mg via INTRAVENOUS

## 2012-03-15 MED ORDER — ACETAMINOPHEN 10 MG/ML IV SOLN
INTRAVENOUS | Status: DC | PRN
  Administered 2012-03-15: 1000 mg via INTRAVENOUS

## 2012-03-15 SURGICAL SUPPLY — 30 items
APPLIER CLIP 5 13 M/L LIGAMAX5 (MISCELLANEOUS)
BANDAGE ADHESIVE 1X3 (GAUZE/BANDAGES/DRESSINGS) ×6 IMPLANT
BENZOIN TINCTURE PRP APPL 2/3 (GAUZE/BANDAGES/DRESSINGS) ×2 IMPLANT
CANISTER SUCTION 2500CC (MISCELLANEOUS) ×2 IMPLANT
CHLORAPREP W/TINT 26ML (MISCELLANEOUS) ×2 IMPLANT
CLIP APPLIE 5 13 M/L LIGAMAX5 (MISCELLANEOUS) IMPLANT
CLOTH BEACON ORANGE TIMEOUT ST (SAFETY) ×2 IMPLANT
CUTTER FLEX LINEAR 45M (STAPLE) ×2 IMPLANT
DECANTER SPIKE VIAL GLASS SM (MISCELLANEOUS) ×2 IMPLANT
DRAPE LAPAROSCOPIC ABDOMINAL (DRAPES) ×2 IMPLANT
ELECT REM PT RETURN 9FT ADLT (ELECTROSURGICAL) ×2
ELECTRODE REM PT RTRN 9FT ADLT (ELECTROSURGICAL) ×1 IMPLANT
GLOVE SURG SIGNA 7.5 PF LTX (GLOVE) ×4 IMPLANT
GOWN STRL NON-REIN LRG LVL3 (GOWN DISPOSABLE) ×2 IMPLANT
GOWN STRL REIN XL XLG (GOWN DISPOSABLE) ×4 IMPLANT
KIT BASIN OR (CUSTOM PROCEDURE TRAY) ×2 IMPLANT
POUCH SPECIMEN RETRIEVAL 10MM (ENDOMECHANICALS) ×2 IMPLANT
RELOAD 45 VASCULAR/THIN (ENDOMECHANICALS) IMPLANT
RELOAD STAPLE TA45 3.5 REG BLU (ENDOMECHANICALS) ×2 IMPLANT
SCALPEL HARMONIC ACE (MISCELLANEOUS) ×2 IMPLANT
SET IRRIG TUBING LAPAROSCOPIC (IRRIGATION / IRRIGATOR) ×2 IMPLANT
SOLUTION ANTI FOG 6CC (MISCELLANEOUS) ×2 IMPLANT
STRIP CLOSURE SKIN 1/2X4 (GAUZE/BANDAGES/DRESSINGS) ×2 IMPLANT
SUT MNCRL AB 4-0 PS2 18 (SUTURE) ×2 IMPLANT
TOWEL OR 17X26 10 PK STRL BLUE (TOWEL DISPOSABLE) ×2 IMPLANT
TRAY FOLEY CATH 14FRSI W/METER (CATHETERS) IMPLANT
TRAY LAP CHOLE (CUSTOM PROCEDURE TRAY) ×2 IMPLANT
TROCAR BLADELESS OPT 5 75 (ENDOMECHANICALS) ×4 IMPLANT
TROCAR XCEL BLUNT TIP 100MML (ENDOMECHANICALS) ×2 IMPLANT
TUBING INSUFFLATION 10FT LAP (TUBING) ×2 IMPLANT

## 2012-03-15 NOTE — ED Notes (Signed)
Patient states he was woke up this morning at 0100 with ruq abdominal pain radiating into his back.   Pain is associated with nausea.

## 2012-03-15 NOTE — Transfer of Care (Signed)
Immediate Anesthesia Transfer of Care Note  Patient: Sean Aguirre  Procedure(s) Performed: Procedure(s) (LRB) with comments: APPENDECTOMY LAPAROSCOPIC (N/A)  Patient Location: PACU  Anesthesia Type: General  Level of Consciousness: awake, alert  and oriented  Airway & Oxygen Therapy: Patient Spontanous Breathing and Patient connected to face mask oxygen  Post-op Assessment: Report given to PACU RN and Post -op Vital signs reviewed and stable  Post vital signs: Reviewed and stable  Complications: No apparent anesthesia complications

## 2012-03-15 NOTE — H&P (Signed)
Sean Aguirre is an 36 y.o. male.   Chief Complaint: Abdominal pain HPI: This is a very pleasant 36 year old gentleman with right lower quadrant abdominal pain and tenderness, and elevated white blood count, and CAT scan findings consistent with acute appendicitis with an appendicolith. 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 He had no problems with anesthesia. On exam, there is tenderness with guarding in the right lower quadrant.  Impression: Acute appendicitis  Plan: We will proceed to the operating room for a laparoscopic appendectomy. I discussed this with the patient in detail. I discussed the risks of surgery which includes but is not limited to bleeding, infection, need to convert to an open procedure, injury to surrounding structures including the ureters, appendiceal stump leak, need for further surgery, et Karie Soda. He understands and wishes to proceed.    Past Medical History  Diagnosis Date  . Rectal pain   . Rectal abscess   . Asthma     as child only  . Right shoulder injury     Past Surgical History  Procedure Date  . Lasik 2003  . Wisdom tooth extraction   . Treatment fistula anal 04/20/11  . Anal fistula plug 08/12/2011    History reviewed. No pertinent family history. Social History:  reports that he quit smoking about 5 years ago. His smoking use included Cigars. He has never used smokeless tobacco. He reports that he drinks alcohol. He reports that he does not use illicit drugs.  Allergies:  Allergies  Allergen Reactions  . Morphine And Related Other (See Comments)    Shaking chills, Rash  . Penicillins Other (See Comments)    Happened in childhood, was told to avoid.      (Not in a hospital admission)  Results for orders placed during the hospital encounter of 03/15/12 (from the past 48 hour(s))  URINALYSIS, ROUTINE W REFLEX MICROSCOPIC     Status: Abnormal   Collection Time   03/15/12 10:50 AM      Component Value Range  Comment   Color, Urine YELLOW  YELLOW    APPearance CLEAR  CLEAR    Specific Gravity, Urine 1.019  1.005 - 1.030    pH 6.0  5.0 - 8.0    Glucose, UA NEGATIVE  NEGATIVE mg/dL    Hgb urine dipstick NEGATIVE  NEGATIVE    Bilirubin Urine NEGATIVE  NEGATIVE    Ketones, ur 15 (*) NEGATIVE mg/dL    Protein, ur NEGATIVE  NEGATIVE mg/dL    Urobilinogen, UA 0.2  0.0 - 1.0 mg/dL    Nitrite NEGATIVE  NEGATIVE    Leukocytes, UA NEGATIVE  NEGATIVE MICROSCOPIC NOT DONE ON URINES WITH NEGATIVE PROTEIN, BLOOD, LEUKOCYTES, NITRITE, OR GLUCOSE <1000 mg/dL.  CBC WITH DIFFERENTIAL     Status: Abnormal   Collection Time   03/15/12 11:25 AM      Component Value Range Comment   WBC 14.8 (*) 4.0 - 10.5 K/uL    RBC 5.20  4.22 - 5.81 MIL/uL    Hemoglobin 14.1  13.0 - 17.0 g/dL    HCT 40.9  81.1 - 91.4 %    MCV 79.0  78.0 - 100.0 fL    MCH 27.1  26.0 - 34.0 pg    MCHC 34.3  30.0 - 36.0 g/dL    RDW 78.2  95.6 - 21.3 %    Platelets 196  150 - 400 K/uL    Neutrophils Relative 83 (*) 43 - 77 %  Neutro Abs 12.3 (*) 1.7 - 7.7 K/uL    Lymphocytes Relative 7 (*) 12 - 46 %    Lymphs Abs 1.1  0.7 - 4.0 K/uL    Monocytes Relative 9  3 - 12 %    Monocytes Absolute 1.4 (*) 0.1 - 1.0 K/uL    Eosinophils Relative 0  0 - 5 %    Eosinophils Absolute 0.0  0.0 - 0.7 K/uL    Basophils Relative 0  0 - 1 %    Basophils Absolute 0.0  0.0 - 0.1 K/uL   COMPREHENSIVE METABOLIC PANEL     Status: Abnormal   Collection Time   03/15/12 11:25 AM      Component Value Range Comment   Sodium 137  135 - 145 mEq/L    Potassium 4.1  3.5 - 5.1 mEq/L    Chloride 102  96 - 112 mEq/L    CO2 25  19 - 32 mEq/L    Glucose, Bld 107 (*) 70 - 99 mg/dL    BUN 12  6 - 23 mg/dL    Creatinine, Ser 1.19  0.50 - 1.35 mg/dL    Calcium 9.6  8.4 - 14.7 mg/dL    Total Protein 7.6  6.0 - 8.3 g/dL    Albumin 4.4  3.5 - 5.2 g/dL    AST 17  0 - 37 U/L    ALT 22  0 - 53 U/L    Alkaline Phosphatase 60  39 - 117 U/L    Total Bilirubin 0.8  0.3 - 1.2  mg/dL    GFR calc non Af Amer >90  >90 mL/min    GFR calc Af Amer >90  >90 mL/min   LIPASE, BLOOD     Status: Normal   Collection Time   03/15/12 11:25 AM      Component Value Range Comment   Lipase 26  11 - 59 U/L    Ct Abdomen Pelvis Wo Contrast  03/15/2012  *RADIOLOGY REPORT*  Clinical Data: Right flank pain and nausea.  CT ABDOMEN AND PELVIS WITHOUT CONTRAST  Technique:  Multidetector CT imaging of the abdomen and pelvis was performed following the standard protocol without intravenous contrast.  Comparison: None.  Findings: Minimal dependent atelectasis is present bilaterally. The lungs are otherwise clear.  The heart size is normal.  No significant pleural or pericardial effusion is present.  A 6 mm hypodense lesion within the medial right lobe of the liver on image 29 likely represents a small cyst.  The liver and spleen are otherwise within normal limits.  The stomach, duodenum, and pancreas are unremarkable.  The common bile duct and gallbladder are within normal limits.  The adrenal glands are normal bilaterally.  A 5 mm nonobstructing stone is present at the upper pole of the left kidney.  No significant right-sided nephrolithiasis is present.  The ureters are within normal limits bilaterally.  The rectosigmoid colon is within normal limits.  The remainder of the colon is unremarkable.  Ill-defined 18 mm appendicolith is present at the base the appendix.  The appendix is dilated, measuring up to 12 mm with inflammatory change, compatible with acute appendicitis.  There is no free air or fluid to suggest rupture. The appendix is retrocecal.  The small bowel is within normal limits.  No significant adenopathy or free fluid is present.  Bone windows demonstrate slight leftward curvature of the lumbar spine and minimal rightward curvature of the lower thoracic spine. Vertebral body heights and AP  alignment are otherwise normal.  A small bone island is present in the femoral head.  No other focal  lytic or blastic lesions are present.  IMPRESSION:  1.  Acute appendicitis with a prominent appendicolith. 2.  No right-sided nephrolithiasis. 3.  5 mm nonobstructing stone at the upper pole of the left kidney.   Original Report Authenticated By: Jamesetta Orleans. MATTERN, M.D.     Review of Systems  Constitutional: Negative for fever, chills, weight loss, malaise/fatigue and diaphoresis.  HENT: Negative.   Eyes: Negative.   Respiratory: Negative.   Cardiovascular: Negative.   Gastrointestinal: Positive for nausea, abdominal pain and diarrhea. Negative for heartburn, vomiting, constipation, blood in stool and melena.  Genitourinary: Negative.   Musculoskeletal: Negative.   Skin: Negative.   Neurological: Negative.  Negative for weakness.  Endo/Heme/Allergies: Negative.   Psychiatric/Behavioral: Negative.     Blood pressure 111/59, pulse 100, temperature 98.6 F (37 C), temperature source Oral, resp. rate 20, height 5\' 11"  (1.803 m), weight 185 lb (83.915 kg), SpO2 99.00%. Physical Exam  Constitutional: He is oriented to person, place, and time. He appears well-developed and well-nourished. No distress.  HENT:  Head: Normocephalic and atraumatic.  Mouth/Throat: No oropharyngeal exudate.  Eyes: Conjunctivae normal and EOM are normal. Pupils are equal, round, and reactive to light. Right eye exhibits no discharge. Left eye exhibits no discharge. No scleral icterus.  Neck: Normal range of motion. Neck supple. No JVD present. No tracheal deviation present. No thyromegaly present.  Cardiovascular: Normal rate, regular rhythm, normal heart sounds and intact distal pulses.  Exam reveals no gallop and no friction rub.   No murmur heard. Respiratory: Effort normal and breath sounds normal. No stridor. No respiratory distress. He has no wheezes. He has no rales. He exhibits no tenderness.  GI: Soft. Bowel sounds are normal. He exhibits no distension and no mass. There is tenderness. There is rebound  and guarding.  Musculoskeletal: Normal range of motion. He exhibits no edema and no tenderness.  Lymphadenopathy:    He has no cervical adenopathy.  Neurological: He is alert and oriented to person, place, and time.  Skin: Skin is warm and dry. He is not diaphoretic.  Psychiatric: He has a normal mood and affect.     Assessment/Plan  Patient Active Problem List  Diagnosis  . Anal fistula   Acute appendicitis  Plan: 1. laparoscopic appendectomy 2. IVF,ABX 3. NPO  4. Pain management 5. DVT and GERD prophylaxis   Alizza Sacra 03/15/2012, 2:33 PM

## 2012-03-15 NOTE — H&P (Signed)
  This is a very pleasant 36 year old gentleman with right lower quadrant abdominal pain and tenderness, and elevated white blood count, and CAT scan findings consistent with acute appendicitis with an appendicolith. 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 He had no problems with anesthesia. On exam, there is tenderness with guarding in the right lower quadrant.  Impression: Acute appendicitis  Plan: We will proceed to the operating room for a laparoscopic appendectomy. I discussed this with the patient in detail. I discussed the risks of surgery which includes but is not limited to bleeding, infection, need to convert to an open procedure, injury to surrounding structures including the ureters, appendiceal stump leak, need for further surgery, et Karie Soda. He understands and wishes to proceed. Likelihood of success is good.

## 2012-03-15 NOTE — Progress Notes (Signed)
Pt unable to void after 6 hr of foley being removed. Attempted to i/o cath, but me resistance. Nurse tech re i/o cath and a few blood clots were obtained and 550cc of tea colored urine obtained. Pt tolerated well.

## 2012-03-15 NOTE — ED Provider Notes (Signed)
History     CSN: [id number]  Arrival date & time 03/15/12  1018   First MD Initiated Contact with Patient 03/15/12 1108      Chief Complaint  Patient presents with  . Abdominal Pain    (Consider location/radiation/quality/duration/timing/severity/associated sxs/prior treatment) HPI Comments: Patient started last night with pain in the right lower quadrant/mid abdomen that has worsened into this morning.  He denies fevers or chills.  There has been no vomiting, diarrhea, or constipation.    Patient is a 36 y.o. male presenting with abdominal pain. The history is provided by the patient.  Abdominal Pain The primary symptoms of the illness include abdominal pain and nausea. The primary symptoms of the illness do not include fever, vomiting, diarrhea or dysuria. Episode onset: last night at 11PM. The onset of the illness was sudden. The problem has been gradually worsening.  Associated with: nothing. The patient states that she believes she is currently not pregnant. Symptoms associated with the illness do not include chills, urgency or frequency.    Past Medical History  Diagnosis Date  . Rectal pain   . Rectal abscess   . Asthma     as child only  . Right shoulder injury     Past Surgical History  Procedure Date  . Lasik 2003  . Wisdom tooth extraction   . Treatment fistula anal 04/20/11  . Anal fistula plug 08/12/2011    No family history on file.  History  Substance Use Topics  . Smoking status: Former Smoker    Types: Cigars    Quit date: 08/05/2006  . Smokeless tobacco: Never Used  . Alcohol Use: 0.0 oz/week     ? 2 per wk      Review of Systems  Constitutional: Negative for fever and chills.  Gastrointestinal: Positive for nausea and abdominal pain. Negative for vomiting and diarrhea.  Genitourinary: Negative for dysuria, urgency and frequency.  All other systems reviewed and are negative.    Allergies  Penicillins  Home Medications   Current  Outpatient Rx  Name Route Sig Dispense Refill  . ADULT MULTIVITAMIN W/MINERALS CH Oral Take 1 tablet by mouth daily.      BP 116/70  Pulse 78  Temp 97.4 F (36.3 C) (Oral)  Resp 20  Ht 5\' 11"  (1.803 m)  Wt 185 lb (83.915 kg)  BMI 25.80 kg/m2  SpO2 100%  Physical Exam  Nursing note reviewed. Constitutional: He is oriented to person, place, and time. He appears well-developed and well-nourished. No distress.  HENT:  Head: Normocephalic and atraumatic.  Mouth/Throat: Oropharynx is clear and moist.  Neck: Normal range of motion. Neck supple.  Cardiovascular: Normal rate and regular rhythm.   No murmur heard. Pulmonary/Chest: Effort normal and breath sounds normal. No respiratory distress. He has no wheezes.  Abdominal: Soft. Bowel sounds are normal.       There is ttp in the right lower quadrant and right mid abdomen.  There is no rebound or guarding.  Bowel sounds are normoactive.  Musculoskeletal: Normal range of motion. He exhibits no edema.  Neurological: He is alert and oriented to person, place, and time.  Skin: Skin is warm and dry. He is not diaphoretic.    ED Course  Procedures (including critical care time)   Labs Reviewed  URINALYSIS, ROUTINE W REFLEX MICROSCOPIC  CBC WITH DIFFERENTIAL  COMPREHENSIVE METABOLIC PANEL  LIPASE, BLOOD   No results found.   No diagnosis found.    MDM  The patient  presents with rlq pain since last night.  The wbc is elevated and the ct confirms acute appendicitis.  I have spoken with Dr. Magnus Ivan at [facility] who agrees to accept the patient in transfer.  He has recommended invanz.          Geoffery Lyons, MD 03/15/12 1242

## 2012-03-15 NOTE — Anesthesia Preprocedure Evaluation (Addendum)
Anesthesia Evaluation  Patient identified by MRN, date of birth, ID band Patient awake    Reviewed: Allergy & Precautions, H&P , NPO status , Patient's Chart, lab work & pertinent test results  Airway Mallampati: II TM Distance: >3 FB Neck ROM: full    Dental No notable dental hx. (+) Teeth Intact, Dental Advisory Given and Caps,    Pulmonary neg pulmonary ROS,  breath sounds clear to auscultation  Pulmonary exam normal       Cardiovascular Exercise Tolerance: Good negative cardio ROS  Rhythm:regular Rate:Normal     Neuro/Psych negative neurological ROS  negative psych ROS   GI/Hepatic negative GI ROS, Neg liver ROS,   Endo/Other  negative endocrine ROS  Renal/GU negative Renal ROS  negative genitourinary   Musculoskeletal   Abdominal   Peds  Hematology negative hematology ROS (+)   Anesthesia Other Findings   Reproductive/Obstetrics negative OB ROS                          Anesthesia Physical Anesthesia Plan  ASA: I and Emergent  Anesthesia Plan: General   Post-op Pain Management:    Induction: Intravenous  Airway Management Planned: Oral ETT  Additional Equipment:   Intra-op Plan:   Post-operative Plan: Extubation in OR  Informed Consent: I have reviewed the patients History and Physical, chart, labs and discussed the procedure including the risks, benefits and alternatives for the proposed anesthesia with the patient or authorized representative who has indicated his/her understanding and acceptance.   Dental Advisory Given  Plan Discussed with: CRNA and Surgeon  Anesthesia Plan Comments:         Anesthesia Quick Evaluation

## 2012-03-15 NOTE — H&P (Signed)
I have seen and examined the patient and agree with the assessment and plans.  Tanazia Achee A. Aboubacar Matsuo  MD, FACS  

## 2012-03-15 NOTE — ED Notes (Signed)
Report to [facility] with [facility]. Report to Unknown Jim nurse at [facility] ED.

## 2012-03-15 NOTE — Op Note (Signed)
Appendectomy, Lap, Procedure Note  Indications: The patient presented with a history of right-sided abdominal pain. A CT revealed findings consistent with acute appendicitis.  Pre-operative Diagnosis: Acute appendicitis without mention of peritonitis  Post-operative Diagnosis: Same  Surgeon: Abigail Miyamoto A   Assistants: 0  Anesthesia: General endotracheal anesthesia  ASA Class: 1  Procedure Details  The patient was seen again in the Holding Room. The risks, benefits, complications, treatment options, and expected outcomes were discussed with the patient and/or family. The possibilities of reaction to medication, perforation of viscus, bleeding, recurrent infection, finding a normal appendix, the need for additional procedures, failure to diagnose a condition, and creating a complication requiring transfusion or operation were discussed. There was concurrence with the proposed plan and informed consent was obtained. The site of surgery was properly noted. The patient was taken to Operating Room, identified as Sean Aguirre and the procedure verified as Appendectomy. A Time Out was held and the above information confirmed.  The patient was placed in the supine position and general anesthesia was induced, along with placement of orogastric tube, Venodyne boots, and a Foley catheter. The abdomen was prepped and draped in a sterile fashion. A one centimeter infraumbilical incision was made.  The umbilical stalk was elevated, and the midline fascia was incised with a #11 blade.  A Kelly clamp was used to confirm entrance into the peritoneal cavity.  A pursestring suture was passed around the incision with a 0 Vicryl.  The Hasson was introduced into the abdomen and the tails of the suture were used to hold the Hasson in place.   The pneumoperitoneum was then established to steady pressure of 15 mmHg.  Additional 5 mm cannulas then placed in the left lower quadrant of the abdomen and the suprapubic  region under direct visualization. A careful evaluation of the entire abdomen was carried out. The patient was placed in Trendelenburg and left lateral decubitus position. The small intestines were retracted in the cephalad and left lateral direction away from the pelvis and right lower quadrant. The patient was found to have an enlarged and inflamed appendix that was retrocecal. There was no evidence of perforation.  The appendix was carefully dissected. The appendix was was skeletonized with the harmonic scalpel.   The appendix was divided at its base using an endo-GIA stapler. Minimal appendiceal stump was left in place. There was no evidence of bleeding, leakage, or complication after division of the appendix. Irrigation was also performed and irrigate suctioned from the abdomen as well.  The umbilical port site was closed with the purse string suture. There was no residual palpable fascial defect.  The trocar site skin wounds were closed with 4-0 Monocryl.  Instrument, sponge, and needle counts were correct at the conclusion of the case.   Findings: The appendix was found to be inflamed and retrocecal. There were not signs of necrosis.  There was not perforation. There was not abscess formation.  Estimated Blood Loss:  Minimal         Drains:0         Complications:  None; patient tolerated the procedure well.         Disposition: PACU - hemodynamically stable.         Condition: stable

## 2012-03-15 NOTE — ED Notes (Signed)
Pt had localized redness above the IV site immediatly after MS was given. No diff breathing, hives or scratchy throat. He started having itching between his fingers a few minutes later. Benadryl 25 mg IV given.

## 2012-03-15 NOTE — ED Notes (Signed)
GEX:BM84<XL> Expected date:<BR> Expected time:<BR> Means of arrival:Ambulance<BR> Comments:<BR> Acute Appy from [facility], to see Magnus Ivan; Landis Gandy; 20g R Fa; morphine, zofran, invanz given.

## 2012-03-15 NOTE — Anesthesia Postprocedure Evaluation (Signed)
  Anesthesia Post-op Note  Patient: Sean Aguirre  Procedure(s) Performed: Procedure(s) (LRB): APPENDECTOMY LAPAROSCOPIC (N/A)  Patient Location: PACU  Anesthesia Type: General  Level of Consciousness: awake and alert   Airway and Oxygen Therapy: Patient Spontanous Breathing  Post-op Pain: mild  Post-op Assessment: Post-op Vital signs reviewed, Patient's Cardiovascular Status Stable, Respiratory Function Stable, Patent Airway and No signs of Nausea or vomiting  Post-op Vital Signs: stable  Complications: No apparent anesthesia complications

## 2012-03-16 ENCOUNTER — Encounter (HOSPITAL_COMMUNITY): Payer: Self-pay | Admitting: Surgery

## 2012-03-16 MED ORDER — OXYCODONE-ACETAMINOPHEN 5-325 MG PO TABS
1.0000 | ORAL_TABLET | ORAL | Status: DC | PRN
  Administered 2012-03-16: 1 via ORAL
  Filled 2012-03-16: qty 1

## 2012-03-16 MED ORDER — METRONIDAZOLE 500 MG PO TABS
500.0000 mg | ORAL_TABLET | Freq: Three times a day (TID) | ORAL | Status: DC
  Administered 2012-03-16: 500 mg via ORAL
  Filled 2012-03-16 (×4): qty 1

## 2012-03-16 MED ORDER — OXYCODONE-ACETAMINOPHEN 5-325 MG PO TABS: 1.0000 | ORAL_TABLET | ORAL | Status: DC | PRN

## 2012-03-16 MED ORDER — METRONIDAZOLE 500 MG PO TABS: 500.0000 mg | ORAL_TABLET | Freq: Three times a day (TID) | ORAL | Status: DC

## 2012-03-16 MED ORDER — CIPROFLOXACIN HCL 500 MG PO TABS
500.0000 mg | ORAL_TABLET | Freq: Two times a day (BID) | ORAL | Status: DC
  Administered 2012-03-16: 500 mg via ORAL
  Filled 2012-03-16 (×3): qty 1

## 2012-03-16 MED ORDER — CIPROFLOXACIN HCL 500 MG PO TABS: 500.0000 mg | ORAL_TABLET | Freq: Two times a day (BID) | ORAL | Status: DC

## 2012-03-16 NOTE — Discharge Summary (Signed)
I have seen and examined the patient and agree with the assessment and plans.  Ary Rudnick A. Tahjir Silveria  MD, FACS  

## 2012-03-16 NOTE — Anesthesia Postprocedure Evaluation (Signed)
  Anesthesia Post-op Note  Patient: Sean Aguirre  Procedure(s) Performed: Procedure(s) (LRB): APPENDECTOMY LAPAROSCOPIC (N/A)  Patient Location: PACU  Anesthesia Type: General  Level of Consciousness: awake and alert   Airway and Oxygen Therapy: Patient Spontanous Breathing  Post-op Pain: mild  Post-op Assessment: Post-op Vital signs reviewed, Patient's Cardiovascular Status Stable, Respiratory Function Stable, Patent Airway and No signs of Nausea or vomiting  Post-op Vital Signs: stable  Complications: No apparent anesthesia complications 

## 2012-03-16 NOTE — Progress Notes (Signed)
Utilization review completed.  

## 2012-03-16 NOTE — Progress Notes (Signed)
Patient discharged, iv catheter removed, tolerated removal without problem. Is self care, discharge instructions reviewed, noting "my chart" and had patient return explanation on how to access it from home. Verbalized understanding of all discharge instructions, prescriptions given to patient. Left unit in wheelchair escorted by staff.

## 2012-03-16 NOTE — Discharge Summary (Signed)
  Patient ID: Sean Aguirre MRN: [id number] DOB/AGE: 1975-08-08 36 y.o.  Admit date: 03/15/2012 Discharge date: 03/16/2012  Procedures: laparoscopic appendectomy  Consults: None  Reason for Admission: 36 year old gentleman with right lower quadrant abdominal pain and tenderness, and elevated white blood count, and CAT scan findings consistent with acute appendicitis with an appendicolith. 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 He had no problems with anesthesia. On exam, there is tenderness with guarding in the right lower quadrant.   Admission Diagnoses:  1. Acute appendicitis 2. H/o anal fissure  Hospital Course: The patient was admitted and taken to the operating room where he underwent a lap appy.  He tolerated the procedure well.  His appendix was not ruptured, but ruptured during the case.  Therefore, he was left on abx therapy after surgery.  The following day, he was tolerating a regular diet and his pain was adequately controlled.  He was felt stable for dc home.  PE: Abd: soft, appropriately tender, +BS, incisions c/d/iw with bandaids.  Discharge Diagnoses:  1. Acute appendicitis, s/p lap appy  Discharge Medications:   Medication List     As of 03/16/2012  8:09 AM    TAKE these medications         acetaminophen 500 MG tablet   Commonly known as: TYLENOL   Take 1,000 mg by mouth every 6 (six) hours as needed. Headache pain      aspirin-acetaminophen-caffeine 250-250-65 MG per tablet   Commonly known as: EXCEDRIN MIGRAINE   Take 2 tablets by mouth every 6 (six) hours as needed. headache      ciprofloxacin 500 MG tablet   Commonly known as: CIPRO   Take 1 tablet (500 mg total) by mouth 2 (two) times daily.      fexofenadine 30 MG tablet   Commonly known as: ALLEGRA   Take 30 mg by mouth daily. allergies      GAS-X EXTRA STRENGTH 125 MG Caps   Generic drug: Simethicone   Take 1 capsule by mouth once.      metroNIDAZOLE 500 MG  tablet   Commonly known as: FLAGYL   Take 1 tablet (500 mg total) by mouth every 8 (eight) hours.      multivitamin with minerals Tabs   Take 1 tablet by mouth daily.      oxyCODONE-acetaminophen 5-325 MG per tablet   Commonly known as: PERCOCET/ROXICET   Take 1-2 tablets by mouth every 4 (four) hours as needed.        Discharge Instructions:     Follow-up Information    Follow up with [facility]. On 03/29/2012. (2:15 pm appointment, arrive at 1:50)    Contact information:   [address] [place], [place] [address] [phone]         Signed: Letha Cape 03/16/2012, 8:09 AM

## 2012-03-19 ENCOUNTER — Encounter (HOSPITAL_COMMUNITY): Payer: Self-pay | Admitting: Emergency Medicine

## 2012-03-19 ENCOUNTER — Emergency Department (HOSPITAL_COMMUNITY): Payer: Managed Care, Other (non HMO)

## 2012-03-19 ENCOUNTER — Telehealth (INDEPENDENT_AMBULATORY_CARE_PROVIDER_SITE_OTHER): Payer: Self-pay | Admitting: General Surgery

## 2012-03-19 ENCOUNTER — Inpatient Hospital Stay (HOSPITAL_COMMUNITY)
Admission: EM | Admit: 2012-03-19 | Discharge: 2012-03-22 | DRG: 862 | Disposition: A | Discharge status: Home / Self Care | Payer: Managed Care, Other (non HMO) | Attending: Surgery | Admitting: Surgery

## 2012-03-19 DIAGNOSIS — K651 Peritoneal abscess: Secondary | ICD-10-CM | POA: Diagnosis present

## 2012-03-19 DIAGNOSIS — L0291 Cutaneous abscess, unspecified: Secondary | ICD-10-CM

## 2012-03-19 DIAGNOSIS — T8140XA Infection following a procedure, unspecified, initial encounter: Principal | ICD-10-CM | POA: Diagnosis present

## 2012-03-19 DIAGNOSIS — Y836 Removal of other organ (partial) (total) as the cause of abnormal reaction of the patient, or of later complication, without mention of misadventure at the time of the procedure: Secondary | ICD-10-CM | POA: Diagnosis present

## 2012-03-19 DIAGNOSIS — Z9089 Acquired absence of other organs: Secondary | ICD-10-CM

## 2012-03-19 LAB — PROTIME-INR
INR: 1.05 (ref 0.00–1.49)
Prothrombin Time: 13.6 seconds (ref 11.6–15.2)

## 2012-03-19 LAB — CBC WITH DIFFERENTIAL/PLATELET
Eosinophils Relative: 1 % (ref 0–5)
Lymphocytes Relative: 10 % — ABNORMAL LOW (ref 12–46)
Lymphs Abs: 1.1 10*3/uL (ref 0.7–4.0)
MCV: 77.8 fL — ABNORMAL LOW (ref 78.0–100.0)
Neutro Abs: 8.8 10*3/uL — ABNORMAL HIGH (ref 1.7–7.7)
Platelets: 214 10*3/uL (ref 150–400)
RBC: 4.92 MIL/uL (ref 4.22–5.81)
WBC: 10.9 10*3/uL — ABNORMAL HIGH (ref 4.0–10.5)

## 2012-03-19 LAB — COMPREHENSIVE METABOLIC PANEL
ALT: 18 U/L (ref 0–53)
Alkaline Phosphatase: 115 U/L (ref 39–117)
CO2: 26 mEq/L (ref 19–32)
Calcium: 8.9 mg/dL (ref 8.4–10.5)
Chloride: 100 mEq/L (ref 96–112)
GFR calc Af Amer: >90 mL/min (ref >90)
GFR calc non Af Amer: >90 mL/min (ref >90)
Glucose, Bld: 148 mg/dL — ABNORMAL HIGH (ref 70–99)
Potassium: 3.3 mEq/L — ABNORMAL LOW (ref 3.5–5.1)
Sodium: 136 mEq/L (ref 135–145)
Total Bilirubin: 0.9 mg/dL (ref 0.3–1.2)

## 2012-03-19 LAB — URINALYSIS, ROUTINE W REFLEX MICROSCOPIC
Glucose, UA: NEGATIVE mg/dL
Hgb urine dipstick: NEGATIVE
Specific Gravity, Urine: 1.012 (ref 1.005–1.030)
Urobilinogen, UA: 0.2 mg/dL (ref 0.0–1.0)

## 2012-03-19 MED ORDER — METRONIDAZOLE IN NACL 5-0.79 MG/ML-% IV SOLN: 500.0000 mg | Freq: Three times a day (TID) | INTRAVENOUS | Status: DC

## 2012-03-19 MED ORDER — KCL IN DEXTROSE-NACL 20-5-0.45 MEQ/L-%-% IV SOLN
INTRAVENOUS | Status: DC
  Administered 2012-03-19 – 2012-03-21 (×4): via INTRAVENOUS
  Filled 2012-03-19 (×7): qty 1000

## 2012-03-19 MED ORDER — HYDROMORPHONE HCL PF 1 MG/ML IJ SOLN
1.0000 mg | Freq: Once | INTRAMUSCULAR | Status: AC
  Administered 2012-03-19: 1 mg via INTRAVENOUS
  Filled 2012-03-19: qty 1

## 2012-03-19 MED ORDER — CIPROFLOXACIN IN D5W 400 MG/200ML IV SOLN: 400.0000 mg | Freq: Two times a day (BID) | INTRAVENOUS | Status: DC

## 2012-03-19 MED ORDER — ONDANSETRON HCL 4 MG/2ML IJ SOLN
4.0000 mg | Freq: Once | INTRAMUSCULAR | Status: AC
  Administered 2012-03-19: 4 mg via INTRAVENOUS
  Filled 2012-03-19: qty 2

## 2012-03-19 MED ORDER — ONDANSETRON HCL 4 MG/2ML IJ SOLN
4.0000 mg | Freq: Four times a day (QID) | INTRAMUSCULAR | Status: DC | PRN
Start: 2012-03-19 — End: 2012-03-22
  Administered 2012-03-20 – 2012-03-22 (×5): 4 mg via INTRAVENOUS
  Filled 2012-03-19 (×5): qty 2

## 2012-03-19 MED ORDER — METRONIDAZOLE IN NACL 5-0.79 MG/ML-% IV SOLN
500.0000 mg | Freq: Three times a day (TID) | INTRAVENOUS | Status: DC
  Administered 2012-03-19 – 2012-03-22 (×9): 500 mg via INTRAVENOUS
  Filled 2012-03-19 (×11): qty 100

## 2012-03-19 MED ORDER — SODIUM CHLORIDE 0.9 % IV BOLUS (SEPSIS)
1000.0000 mL | Freq: Once | INTRAVENOUS | Status: AC
  Administered 2012-03-19: 1000 mL via INTRAVENOUS

## 2012-03-19 MED ORDER — OXYCODONE-ACETAMINOPHEN 5-325 MG PO TABS
1.0000 | ORAL_TABLET | ORAL | Status: DC | PRN
  Administered 2012-03-19 – 2012-03-22 (×8): 2 via ORAL
  Filled 2012-03-19 (×8): qty 2

## 2012-03-19 MED ORDER — IOHEXOL 300 MG/ML  SOLN
100.0000 mL | Freq: Once | INTRAMUSCULAR | Status: AC | PRN
  Administered 2012-03-19: 100 mL via INTRAVENOUS

## 2012-03-19 MED ORDER — HYDROMORPHONE HCL PF 1 MG/ML IJ SOLN
0.5000 mg | INTRAMUSCULAR | Status: DC | PRN
  Administered 2012-03-19 – 2012-03-21 (×5): 0.5 mg via INTRAVENOUS
  Filled 2012-03-19 (×5): qty 1

## 2012-03-19 MED ORDER — CIPROFLOXACIN IN D5W 400 MG/200ML IV SOLN
400.0000 mg | Freq: Two times a day (BID) | INTRAVENOUS | Status: DC
  Administered 2012-03-19 – 2012-03-22 (×6): 400 mg via INTRAVENOUS
  Filled 2012-03-19 (×8): qty 200

## 2012-03-19 MED ORDER — ENOXAPARIN SODIUM 40 MG/0.4ML ~~LOC~~ SOLN: 40.0000 mg | SUBCUTANEOUS | Status: DC

## 2012-03-19 NOTE — ED Provider Notes (Signed)
Complains of abdominal pain since appendectomy earlier this week. Patient alert nontoxic CT scan of abdomen / pelvisconsistent with postoperative  suspected abscess  Doug Sou, MD 03/19/12 1317

## 2012-03-19 NOTE — ED Notes (Signed)
Report called to Paula, RN

## 2012-03-19 NOTE — ED Notes (Signed)
Pt presents w/ abdominal pain starting at 2300 last p.m. Lap Appendectomy on Tuesday discharged on Wednesday. Worse pain since his surgery, nausea w/o emesis, 4 diarrhea stools yesterday. Taking Percocet for pain and discharge on Flagyl and Cipro

## 2012-03-19 NOTE — ED Provider Notes (Signed)
History     CSN: [id number]  Arrival date & time 03/19/12  1028   First MD Initiated Contact with Patient 03/19/12 1042      Chief Complaint  Patient presents with  . Abdominal Pain    (Consider location/radiation/quality/duration/timing/severity/associated sxs/prior treatment) The history is provided by the patient, medical records and the spouse.  Sean Aguirre is a 36 y.o. male presents to the emergency department complaining of abdominal pain.  The onset of the symptoms was  abrupt starting 12 hours ago.  The patient has associated nausea, bloating.  The symptoms have been  persistent, gradually worsened.  nothing makes the symptoms worse and nothing makes symptoms better.  The patient denies fever, chills, vomiting, constipation, diaphoresis, syncope, chest pain, dysuria.  Pt states he had a Lap Appendectomy 5 days ago without complication and was discharged the next day.  He has been taking percocet for the pain along with flagyl and cipro.  He had a BM yesterday that was more liquid than formed stool, but pt states it was not painful.  Last night he had increasing pain in the RLQ that was not handled with the percocet. Pt states he had to sleep with his legs in the "frog leg position" to relieve the increasing pressure in his abd.  He states this morning he awoke with more pain and nausea and was unable to stand/walk/sit without increasing pain.  Pt states he felt almost normal yesterday prior to this happening.  Appendectomy performed by Dr Magnus Ivan.  Dr Biagio Quint was on call for the practice and spoke with the patient this morning indicating that he should be seen here for a CT scan and further evaluation.    Past Medical History  Diagnosis Date  . Rectal pain   . Rectal abscess   . Asthma     as child only  . Right shoulder injury     Past Surgical History  Procedure Date  . Lasik 2003  . Wisdom tooth extraction   . Treatment fistula anal 04/20/11  . Anal fistula plug 08/12/2011   . Laparoscopic appendectomy 03/15/2012    Procedure: APPENDECTOMY LAPAROSCOPIC;  Surgeon: Shelly Rubenstein, MD;  Location: [facility];  Service: General;  Laterality: N/A;  . Appendectomy     No family history on file.  History  Substance Use Topics  . Smoking status: Former Smoker    Types: Cigars    Quit date: 08/05/2006  . Smokeless tobacco: Former Neurosurgeon    Types: Chew  . Alcohol Use: 1.2 oz/week    2 Cans of beer per week     ? 2 per wk      Review of Systems  Constitutional: Negative for fever, diaphoresis, appetite change, fatigue and unexpected weight change.  HENT: Negative for mouth sores, trouble swallowing, neck pain and neck stiffness.   Respiratory: Negative for cough, chest tightness, shortness of breath, wheezing and stridor.   Cardiovascular: Negative for chest pain and palpitations.  Gastrointestinal: Positive for nausea, abdominal pain, diarrhea and abdominal distention. Negative for vomiting, constipation, blood in stool and rectal pain.  Genitourinary: Negative for dysuria, urgency, frequency, hematuria, flank pain and difficulty urinating.  Musculoskeletal: Negative for back pain.  Skin: Negative for rash.  Neurological: Negative for weakness.  Hematological: Negative for adenopathy.  Psychiatric/Behavioral: Negative for confusion.  All other systems reviewed and are negative.    Allergies  Morphine and related and Penicillins  Home Medications   No current outpatient prescriptions on file.  BP  112/65  Pulse 79  Temp 99.9 F (37.7 C) (Oral)  Resp 20  SpO2 94%  Physical Exam  Nursing note and vitals reviewed. Constitutional: He appears well-developed and well-nourished.  HENT:  Head: Normocephalic and atraumatic.  Mouth/Throat: Oropharynx is clear and moist.  Eyes: Conjunctivae normal are normal. No scleral icterus.  Neck: Normal range of motion.  Cardiovascular: Normal rate, regular rhythm, normal heart sounds and intact distal pulses.   Exam reveals no gallop and no friction rub.   No murmur heard. Pulmonary/Chest: Effort normal and breath sounds normal. No respiratory distress. He has no wheezes.  Abdominal: Soft. He exhibits no distension and no mass. Bowel sounds are decreased. There is no hepatosplenomegaly. There is tenderness in the right lower quadrant and suprapubic area. There is rigidity and guarding. There is no CVA tenderness.  Musculoskeletal: Normal range of motion. He exhibits no edema and no tenderness.  Neurological: He is alert.  Skin: Skin is warm and dry. There is pallor.  Psychiatric: He has a normal mood and affect.    ED Course  Procedures (including critical care time)  Labs Reviewed  CBC WITH DIFFERENTIAL - Abnormal; Notable for the following:    WBC 10.9 (*)     HCT 38.3 (*)     MCV 77.8 (*)     Neutrophils Relative 81 (*)     Neutro Abs 8.8 (*)     Lymphocytes Relative 10 (*)     All other components within normal limits  COMPREHENSIVE METABOLIC PANEL - Abnormal; Notable for the following:    Potassium 3.3 (*)     Glucose, Bld 148 (*)     Albumin 3.2 (*)     All other components within normal limits  URINALYSIS, ROUTINE W REFLEX MICROSCOPIC  PROTIME-INR  BASIC METABOLIC PANEL  CBC  PROTIME-INR   Dg Chest 2 View  03/19/2012  *RADIOLOGY REPORT*  Clinical Data: Postop appendectomy.  Nausea.  Possible fever. Lower abdominal pain.  CHEST - 2 VIEW  Comparison: None.  Findings: Mild posterior dependent lung base opacity is most consistent with atelectasis.  Infiltrate is not excluded but less likely.  The lungs are otherwise clear.  Normal heart, mediastinum and hila.  Dislocated right AC joint, likely chronic.  IMPRESSION: Mild lung base opacity most consistent with atelectasis. Infiltrate is possible but less likely.   Original Report Authenticated By: Domenic Moras, M.D.    Ct Abdomen Pelvis W Contrast  03/19/2012  *RADIOLOGY REPORT*  Clinical Data: The knee.  Right lower quadrant pain,  fever and nausea.  CT ABDOMEN AND PELVIS WITH CONTRAST  Technique:  Multidetector CT imaging of the abdomen and pelvis was performed following the standard protocol during bolus administration of intravenous contrast.  Contrast: OMNIPAQUE IOHEXOL 300 MG/ML  SOLN  Comparison: 03/15/2012  Findings: Since prior exam, of the inflamed appendix has been removed.  There is a small amount of ill-defined fluid adjacent to the cecal tip and along the retrocecal space leading to thickening of the lateral coronal fascia and anterior inferior pararenal fascia.  There is no defined abscess in this location.  In the pelvis, however, there is a defined fluid collection anterior to the rectosigmoid.  It measures 5.2 cm x 3.5 cm x 5.6 cm in size. This could reflect an abscess.  There is mild wall thickening of the terminal ileum beginning approximate 4 cm proximal to the ileocecal valve and extending for 5-6 cm in length.  This is likely reactive wall edema  to adjacent inflammation.  There is no free air.  Mild increase stool is seen throughout the colon.  There are no other areas of bowel wall thickening.  The lung bases show small effusions with dependent atelectasis. The lungs otherwise clear and the heart is normal in size.  There is a small low density lesion in the posterior segment of the right lobe of the liver.  This is likely a cyst.  It is stable.  The liver is otherwise unremarkable.  The spleen is mildly enlarged measuring 14.3 cm in greatest transverse dimension.  There is no splenic mass or focal lesion.  Normal gallbladder.  Normal pancreas.  No bile duct dilation.  No adrenal masses.  There is a nonobstructing stone in the mid pole of the left kidney.  This is stable.  Kidneys otherwise unremarkable.  Normal ureters and bladder.  No adenopathy.  Mild degenerative changes of the lower thoracic and lumbar spine.  No osteoblastic or osteolytic lesions.  IMPRESSION: There is ill-defined fluid attenuation and  inflammatory stranding in the right lower quadrant, below the cecum and in the retrocecal space, but no defined collection in this location to suggest an abscess.  There is thickening of a portion of the terminal ileum that is likely reactive wall thickening to adjacent inflammation.  There is a defined fluid collection measuring 5.6 cm in greatest dimension in the posterior pelvic recess anterior to the rectosigmoid.  This could reflect an abscess.   Original Report Authenticated By: Domenic Moras, M.D.     Results for orders placed during the hospital encounter of 03/19/12  URINALYSIS, ROUTINE W REFLEX MICROSCOPIC      Component Value Range   Color, Urine YELLOW  YELLOW   APPearance CLEAR  CLEAR   Specific Gravity, Urine 1.012  1.005 - 1.030   pH 6.5  5.0 - 8.0   Glucose, UA NEGATIVE  NEGATIVE mg/dL   Hgb urine dipstick NEGATIVE  NEGATIVE   Bilirubin Urine NEGATIVE  NEGATIVE   Ketones, ur NEGATIVE  NEGATIVE mg/dL   Protein, ur NEGATIVE  NEGATIVE mg/dL   Urobilinogen, UA 0.2  0.0 - 1.0 mg/dL   Nitrite NEGATIVE  NEGATIVE   Leukocytes, UA NEGATIVE  NEGATIVE  CBC WITH DIFFERENTIAL      Component Value Range   WBC 10.9 (*) 4.0 - 10.5 K/uL   RBC 4.92  4.22 - 5.81 MIL/uL   Hemoglobin 13.3  13.0 - 17.0 g/dL   HCT 16.1 (*) 09.6 - 04.5 %   MCV 77.8 (*) 78.0 - 100.0 fL   MCH 27.0  26.0 - 34.0 pg   MCHC 34.7  30.0 - 36.0 g/dL   RDW 40.9  81.1 - 91.4 %   Platelets 214  150 - 400 K/uL   Neutrophils Relative 81 (*) 43 - 77 %   Neutro Abs 8.8 (*) 1.7 - 7.7 K/uL   Lymphocytes Relative 10 (*) 12 - 46 %   Lymphs Abs 1.1  0.7 - 4.0 K/uL   Monocytes Relative 8  3 - 12 %   Monocytes Absolute 0.9  0.1 - 1.0 K/uL   Eosinophils Relative 1  0 - 5 %   Eosinophils Absolute 0.1  0.0 - 0.7 K/uL   Basophils Relative 0  0 - 1 %   Basophils Absolute 0.0  0.0 - 0.1 K/uL  COMPREHENSIVE METABOLIC PANEL      Component Value Range   Sodium 136  135 - 145 mEq/L   Potassium 3.3 (*)  3.5 - 5.1 mEq/L   Chloride  100  96 - 112 mEq/L   CO2 26  19 - 32 mEq/L   Glucose, Bld 148 (*) 70 - 99 mg/dL   BUN 8  6 - 23 mg/dL   Creatinine, Ser 1.61  0.50 - 1.35 mg/dL   Calcium 8.9  8.4 - 09.6 mg/dL   Total Protein 7.2  6.0 - 8.3 g/dL   Albumin 3.2 (*) 3.5 - 5.2 g/dL   AST 20  0 - 37 U/L   ALT 18  0 - 53 U/L   Alkaline Phosphatase 115  39 - 117 U/L   Total Bilirubin 0.9  0.3 - 1.2 mg/dL   GFR calc non Af Amer >90  >90 mL/min   GFR calc Af Amer >90  >90 mL/min  PROTIME-INR      Component Value Range   Prothrombin Time 13.6  11.6 - 15.2 seconds   INR 1.05  0.00 - 1.49   Ct Abdomen Pelvis Wo Contrast  03/15/2012  *RADIOLOGY REPORT*  Clinical Data: Right flank pain and nausea.  CT ABDOMEN AND PELVIS WITHOUT CONTRAST  Technique:  Multidetector CT imaging of the abdomen and pelvis was performed following the standard protocol without intravenous contrast.  Comparison: None.  Findings: Minimal dependent atelectasis is present bilaterally. The lungs are otherwise clear.  The heart size is normal.  No significant pleural or pericardial effusion is present.  A 6 mm hypodense lesion within the medial right lobe of the liver on image 29 likely represents a small cyst.  The liver and spleen are otherwise within normal limits.  The stomach, duodenum, and pancreas are unremarkable.  The common bile duct and gallbladder are within normal limits.  The adrenal glands are normal bilaterally.  A 5 mm nonobstructing stone is present at the upper pole of the left kidney.  No significant right-sided nephrolithiasis is present.  The ureters are within normal limits bilaterally.  The rectosigmoid colon is within normal limits.  The remainder of the colon is unremarkable.  Ill-defined 18 mm appendicolith is present at the base the appendix.  The appendix is dilated, measuring up to 12 mm with inflammatory change, compatible with acute appendicitis.  There is no free air or fluid to suggest rupture. The appendix is retrocecal.  The small bowel  is within normal limits.  No significant adenopathy or free fluid is present.  Bone windows demonstrate slight leftward curvature of the lumbar spine and minimal rightward curvature of the lower thoracic spine. Vertebral body heights and AP alignment are otherwise normal.  A small bone island is present in the femoral head.  No other focal lytic or blastic lesions are present.  IMPRESSION:  1.  Acute appendicitis with a prominent appendicolith. 2.  No right-sided nephrolithiasis. 3.  5 mm nonobstructing stone at the upper pole of the left kidney.   Original Report Authenticated By: Jamesetta Orleans. MATTERN, M.D.    Dg Chest 2 View  03/19/2012  *RADIOLOGY REPORT*  Clinical Data: Postop appendectomy.  Nausea.  Possible fever. Lower abdominal pain.  CHEST - 2 VIEW  Comparison: None.  Findings: Mild posterior dependent lung base opacity is most consistent with atelectasis.  Infiltrate is not excluded but less likely.  The lungs are otherwise clear.  Normal heart, mediastinum and hila.  Dislocated right AC joint, likely chronic.  IMPRESSION: Mild lung base opacity most consistent with atelectasis. Infiltrate is possible but less likely.   Original Report Authenticated By: Domenic Moras,  M.D.    Ct Abdomen Pelvis W Contrast  03/19/2012  *RADIOLOGY REPORT*  Clinical Data: The knee.  Right lower quadrant pain, fever and nausea.  CT ABDOMEN AND PELVIS WITH CONTRAST  Technique:  Multidetector CT imaging of the abdomen and pelvis was performed following the standard protocol during bolus administration of intravenous contrast.  Contrast: OMNIPAQUE IOHEXOL 300 MG/ML  SOLN  Comparison: 03/15/2012  Findings: Since prior exam, of the inflamed appendix has been removed.  There is a small amount of ill-defined fluid adjacent to the cecal tip and along the retrocecal space leading to thickening of the lateral coronal fascia and anterior inferior pararenal fascia.  There is no defined abscess in this location.  In the  pelvis, however, there is a defined fluid collection anterior to the rectosigmoid.  It measures 5.2 cm x 3.5 cm x 5.6 cm in size. This could reflect an abscess.  There is mild wall thickening of the terminal ileum beginning approximate 4 cm proximal to the ileocecal valve and extending for 5-6 cm in length.  This is likely reactive wall edema to adjacent inflammation.  There is no free air.  Mild increase stool is seen throughout the colon.  There are no other areas of bowel wall thickening.  The lung bases show small effusions with dependent atelectasis. The lungs otherwise clear and the heart is normal in size.  There is a small low density lesion in the posterior segment of the right lobe of the liver.  This is likely a cyst.  It is stable.  The liver is otherwise unremarkable.  The spleen is mildly enlarged measuring 14.3 cm in greatest transverse dimension.  There is no splenic mass or focal lesion.  Normal gallbladder.  Normal pancreas.  No bile duct dilation.  No adrenal masses.  There is a nonobstructing stone in the mid pole of the left kidney.  This is stable.  Kidneys otherwise unremarkable.  Normal ureters and bladder.  No adenopathy.  Mild degenerative changes of the lower thoracic and lumbar spine.  No osteoblastic or osteolytic lesions.  IMPRESSION: There is ill-defined fluid attenuation and inflammatory stranding in the right lower quadrant, below the cecum and in the retrocecal space, but no defined collection in this location to suggest an abscess.  There is thickening of a portion of the terminal ileum that is likely reactive wall thickening to adjacent inflammation.  There is a defined fluid collection measuring 5.6 cm in greatest dimension in the posterior pelvic recess anterior to the rectosigmoid.  This could reflect an abscess.   Original Report Authenticated By: Domenic Moras, M.D.      1. Abscess       MDM  Marko Stai presents 5 days after appendectomy with increasing  abdominal pain and bloating.  Concern for intra-abdominal abscess, abdominal bleed, bowel perforation.  Mild leukocytosis of 10.9, decreased from day of admission for appendicitis. CMP UA negative PT/INR within normal limits. CT abdomen indicative of possible abscess measuring 5.6 cm in the posterior pelvic recess anterior to the rectosigmoid. [facility] contacted and is willing to admit the patient.    Dr. Ethelda Chick was consulted, evaluated this patient with me and agrees with the plan.           Dahlia Client [place], PA-C 03/19/12 1800

## 2012-03-19 NOTE — ED Notes (Signed)
Pt alert and oriented x4. Respirations even and unlabored, bilateral symmetrical rise and fall of chest. Skin warm and dry. In no acute distress. Denies needs.   Pt to CT

## 2012-03-19 NOTE — H&P (Signed)
Sean Aguirre is an 36 y.o. male.  HPI: this patient is known to our service for a recent laparoscopic appendectomy earlier this week. He did well from his surgery and progressed each day and was feeling better at home until he began having right-sided abdominal pain yesterday. He says that the pain has persisted until this morning and he called asking for directions. I recommended that he come in to the emergency room for evaluation and CT scans obtained in the emergency room was consistent with a 5 cm fluid collection in the pelvis consistent with an abscess. He also has some inflammatory changes around his cecum and terminal ileum but no obvious leak or free fluid or free air. He has had chills but no measured fevers. He also has had persistent nausea to go along with the abdominal pain as well as some abdominal bloating and distention. He's had diarrhea.  Past Medical History  Diagnosis Date  . Rectal pain   . Rectal abscess   . Asthma     as child only  . Right shoulder injury     Past Surgical History  Procedure Date  . Lasik 2003  . Wisdom tooth extraction   . Treatment fistula anal 04/20/11  . Anal fistula plug 08/12/2011  . Laparoscopic appendectomy 03/15/2012    Procedure: APPENDECTOMY LAPAROSCOPIC;  Surgeon: Shelly Rubenstein, MD;  Location: [facility];  Service: General;  Laterality: N/A;  . Appendectomy     No family history on file.  Social History:  reports that he quit smoking about 5 years ago. His smoking use included Cigars. He has quit using smokeless tobacco. His smokeless tobacco use included Chew. He reports that he drinks about 1.2 ounces of alcohol per week. He reports that he does not use illicit drugs.  Allergies:  Allergies  Allergen Reactions  . Morphine And Related Other (See Comments)    Shaking chills, Rash.  Has tolerated Vicodin and dilaudid in past per pt.  . Penicillins Other (See Comments)    Happened in childhood, was told to avoid.      Medications: I have reviewed the patient's current medications.  Results for orders placed during the hospital encounter of 03/19/12 (from the past 48 hour(s))  CBC WITH DIFFERENTIAL     Status: Abnormal   Collection Time   03/19/12 11:07 AM      Component Value Range Comment   WBC 10.9 (*) 4.0 - 10.5 K/uL    RBC 4.92  4.22 - 5.81 MIL/uL    Hemoglobin 13.3  13.0 - 17.0 g/dL    HCT 62.9 (*) 52.8 - 52.0 %    MCV 77.8 (*) 78.0 - 100.0 fL    MCH 27.0  26.0 - 34.0 pg    MCHC 34.7  30.0 - 36.0 g/dL    RDW 41.3  24.4 - 01.0 %    Platelets 214  150 - 400 K/uL    Neutrophils Relative 81 (*) 43 - 77 %    Neutro Abs 8.8 (*) 1.7 - 7.7 K/uL    Lymphocytes Relative 10 (*) 12 - 46 %    Lymphs Abs 1.1  0.7 - 4.0 K/uL    Monocytes Relative 8  3 - 12 %    Monocytes Absolute 0.9  0.1 - 1.0 K/uL    Eosinophils Relative 1  0 - 5 %    Eosinophils Absolute 0.1  0.0 - 0.7 K/uL    Basophils Relative 0  0 - 1 %  Basophils Absolute 0.0  0.0 - 0.1 K/uL   COMPREHENSIVE METABOLIC PANEL     Status: Abnormal   Collection Time   03/19/12 11:07 AM      Component Value Range Comment   Sodium 136  135 - 145 mEq/L    Potassium 3.3 (*) 3.5 - 5.1 mEq/L    Chloride 100  96 - 112 mEq/L    CO2 26  19 - 32 mEq/L    Glucose, Bld 148 (*) 70 - 99 mg/dL    BUN 8  6 - 23 mg/dL    Creatinine, Ser 1.61  0.50 - 1.35 mg/dL    Calcium 8.9  8.4 - 09.6 mg/dL    Total Protein 7.2  6.0 - 8.3 g/dL    Albumin 3.2 (*) 3.5 - 5.2 g/dL    AST 20  0 - 37 U/L    ALT 18  0 - 53 U/L    Alkaline Phosphatase 115  39 - 117 U/L    Total Bilirubin 0.9  0.3 - 1.2 mg/dL    GFR calc non Af Amer >90  >90 mL/min    GFR calc Af Amer >90  >90 mL/min   URINALYSIS, ROUTINE W REFLEX MICROSCOPIC     Status: Normal   Collection Time   03/19/12 11:43 AM      Component Value Range Comment   Color, Urine YELLOW  YELLOW    APPearance CLEAR  CLEAR    Specific Gravity, Urine 1.012  1.005 - 1.030    pH 6.5  5.0 - 8.0    Glucose, UA NEGATIVE   NEGATIVE mg/dL    Hgb urine dipstick NEGATIVE  NEGATIVE    Bilirubin Urine NEGATIVE  NEGATIVE    Ketones, ur NEGATIVE  NEGATIVE mg/dL    Protein, ur NEGATIVE  NEGATIVE mg/dL    Urobilinogen, UA 0.2  0.0 - 1.0 mg/dL    Nitrite NEGATIVE  NEGATIVE    Leukocytes, UA NEGATIVE  NEGATIVE MICROSCOPIC NOT DONE ON URINES WITH NEGATIVE PROTEIN, BLOOD, LEUKOCYTES, NITRITE, OR GLUCOSE <1000 mg/dL.    Dg Chest 2 View  03/19/2012  *RADIOLOGY REPORT*  Clinical Data: Postop appendectomy.  Nausea.  Possible fever. Lower abdominal pain.  CHEST - 2 VIEW  Comparison: None.  Findings: Mild posterior dependent lung base opacity is most consistent with atelectasis.  Infiltrate is not excluded but less likely.  The lungs are otherwise clear.  Normal heart, mediastinum and hila.  Dislocated right AC joint, likely chronic.  IMPRESSION: Mild lung base opacity most consistent with atelectasis. Infiltrate is possible but less likely.   Original Report Authenticated By: Domenic Moras, M.D.    Ct Abdomen Pelvis W Contrast  03/19/2012  *RADIOLOGY REPORT*  Clinical Data: The knee.  Right lower quadrant pain, fever and nausea.  CT ABDOMEN AND PELVIS WITH CONTRAST  Technique:  Multidetector CT imaging of the abdomen and pelvis was performed following the standard protocol during bolus administration of intravenous contrast.  Contrast: OMNIPAQUE IOHEXOL 300 MG/ML  SOLN  Comparison: 03/15/2012  Findings: Since prior exam, of the inflamed appendix has been removed.  There is a small amount of ill-defined fluid adjacent to the cecal tip and along the retrocecal space leading to thickening of the lateral coronal fascia and anterior inferior pararenal fascia.  There is no defined abscess in this location.  In the pelvis, however, there is a defined fluid collection anterior to the rectosigmoid.  It measures 5.2 cm x 3.5 cm x 5.6  cm in size. This could reflect an abscess.  There is mild wall thickening of the terminal ileum beginning  approximate 4 cm proximal to the ileocecal valve and extending for 5-6 cm in length.  This is likely reactive wall edema to adjacent inflammation.  There is no free air.  Mild increase stool is seen throughout the colon.  There are no other areas of bowel wall thickening.  The lung bases show small effusions with dependent atelectasis. The lungs otherwise clear and the heart is normal in size.  There is a small low density lesion in the posterior segment of the right lobe of the liver.  This is likely a cyst.  It is stable.  The liver is otherwise unremarkable.  The spleen is mildly enlarged measuring 14.3 cm in greatest transverse dimension.  There is no splenic mass or focal lesion.  Normal gallbladder.  Normal pancreas.  No bile duct dilation.  No adrenal masses.  There is a nonobstructing stone in the mid pole of the left kidney.  This is stable.  Kidneys otherwise unremarkable.  Normal ureters and bladder.  No adenopathy.  Mild degenerative changes of the lower thoracic and lumbar spine.  No osteoblastic or osteolytic lesions.  IMPRESSION: There is ill-defined fluid attenuation and inflammatory stranding in the right lower quadrant, below the cecum and in the retrocecal space, but no defined collection in this location to suggest an abscess.  There is thickening of a portion of the terminal ileum that is likely reactive wall thickening to adjacent inflammation.  There is a defined fluid collection measuring 5.6 cm in greatest dimension in the posterior pelvic recess anterior to the rectosigmoid.  This could reflect an abscess.   Original Report Authenticated By: Domenic Moras, M.D.     All other review of systems negative or noncontributory except as stated in the HPI  Blood pressure 111/59, pulse 94, temperature 98.5 F (36.9 C), temperature source Oral, SpO2 96.00%. General appearance: alert, cooperative and no distress Resp: nonlabored Cardio: normal rate, regular GI: soft, mild distension,  wounds okay, he has focal RLQ tenderness without peritoneal signs. Extremities: extremities normal, atraumatic, no cyanosis or edema  Assessment/Plan: Status post lap Appendectomy with abdominal pain and pelvic fluid collection consistent with postoperative abscess. His CT scan is concerning for postop abscess without any evidence of obvious leak or abdominal catastrophe. I have recommended hospital admission with IV fluids and antibiotics and we will ask interventional radiology to evaluate for possible percutaneous drainage. I will keep him n.p.o. For today given his nausea but we should be able to advance his diet as soon as this is drained.  Lodema Pilot DAVID 03/19/2012, 1:31 PM

## 2012-03-19 NOTE — Telephone Encounter (Signed)
Patient called with increased abdominal pain in RLQ 5 days after lap appy.  He had been getting better but has had increased pain starting this am.  +nausea but has been tolerating diet.  No measured fevers but he says that his abdomen is somewhat bloated as well.  I recommended that he come into the ER for evaluation with labs and repeat CT to eval for postop complication such as abscess.

## 2012-03-20 ENCOUNTER — Inpatient Hospital Stay (HOSPITAL_COMMUNITY): Payer: Managed Care, Other (non HMO)

## 2012-03-20 LAB — BASIC METABOLIC PANEL
BUN: 7 mg/dL (ref 6–23)
Calcium: 8.3 mg/dL — ABNORMAL LOW (ref 8.4–10.5)
Chloride: 103 mEq/L (ref 96–112)
Creatinine, Ser: 0.84 mg/dL (ref 0.50–1.35)
GFR calc Af Amer: >90 mL/min (ref >90)

## 2012-03-20 LAB — PROTIME-INR: INR: 1.07 (ref 0.00–1.49)

## 2012-03-20 LAB — CBC
HCT: 34.7 % — ABNORMAL LOW (ref 39.0–52.0)
MCHC: 33.7 g/dL (ref 30.0–36.0)
MCV: 78 fL (ref 78.0–100.0)
Platelets: 198 10*3/uL (ref 150–400)
RDW: 14.1 % (ref 11.5–15.5)
WBC: 8.1 10*3/uL (ref 4.0–10.5)

## 2012-03-20 MED ORDER — MIDAZOLAM HCL 2 MG/2ML IJ SOLN
INTRAMUSCULAR | Status: AC
  Filled 2012-03-20: qty 6

## 2012-03-20 MED ORDER — MIDAZOLAM HCL 5 MG/5ML IJ SOLN
INTRAMUSCULAR | Status: AC | PRN
  Administered 2012-03-20: 1 mg via INTRAVENOUS
  Administered 2012-03-20: 2 mg via INTRAVENOUS
  Administered 2012-03-20: 1 mg via INTRAVENOUS

## 2012-03-20 MED ORDER — FENTANYL CITRATE 0.05 MG/ML IJ SOLN
INTRAMUSCULAR | Status: AC | PRN
  Administered 2012-03-20: 50 ug via INTRAVENOUS
  Administered 2012-03-20: 100 ug via INTRAVENOUS
  Administered 2012-03-20: 50 ug via INTRAVENOUS

## 2012-03-20 MED ORDER — FENTANYL CITRATE 0.05 MG/ML IJ SOLN
INTRAMUSCULAR | Status: AC
  Filled 2012-03-20: qty 6

## 2012-03-20 NOTE — Plan of Care (Signed)
Problem: Phase I Progression Outcomes Goal: Incision/dressings dry and intact Outcome: Completed/Met Date Met:  03/20/12 Incisional abdominal areas x3 from Tuesday surgery wnl. Bandaides intact. Dressing dry &  intact to new abscess drain site on lt gluteal.

## 2012-03-20 NOTE — Progress Notes (Signed)
Patient requested PO percocet for pain rated at a 4. Patient educated regarding NPO status. Pt stated the Dilaudid IV did not last long enough and he preferred the Percocet. Administered with only a SIP of water. Will monitor. Sean Aguirre [place], [place] 03/20/2012 6:31 AM

## 2012-03-20 NOTE — ED Provider Notes (Signed)
Medical screening examination/treatment/procedure(s) were conducted as a shared visit with non-physician practitioner(s) and myself.  I personally evaluated the patient during the encounter  Doug Sou, MD 03/20/12 1723

## 2012-03-20 NOTE — Procedures (Signed)
Successful placement of a 10 French drain into the abscess/fluid collection within the left hemipelvis via TG approach. Approximately 20 mL of bloody fluid aspirated. Samples sent to lab as requested. No immediate post procedural complications.

## 2012-03-20 NOTE — Progress Notes (Signed)
Patient ID: Sean Aguirre, male   DOB: 02-Feb-1976, 36 y.o.   MRN: [id number]    Subjective: Pain today is a little more diffuse across his lower abdomen although still greatest in the right lower quadrant and overall not any more severe. No nausea.  Objective: Vital signs in last 24 hours: Temp:  [98.3 F (36.8 C)-99.9 F (37.7 C)] 98.4 F (36.9 C) (09/22 0600) Pulse Rate:  [72-94] 73  (09/22 0600) Resp:  [16-20] 16  (09/22 0600) BP: (100-119)/(58-65) 101/65 mmHg (09/22 0600) SpO2:  [93 %-96 %] 94 % (09/22 0600) Weight:  [180 lb (81.647 kg)-182 lb (82.555 kg)] 182 lb (82.555 kg) (09/21 1942) Last BM Date: 03/18/12  Intake/Output from previous day: 09/21 0701 - 09/22 0700 In: 1912 [I.V.:1612; IV Piggyback:300] Out: 600 [Urine:600] Intake/Output this shift:    General appearance: alert and no distress GI: abnormal findings:  moderate tenderness in the RLQ and and less so in the left lower quadrant with some guarding in the right lower quadrant  Lab Results:   Basename 03/20/12 0355 03/19/12 1107  WBC 8.1 10.9*  HGB 11.7* 13.3  HCT 34.7* 38.3*  PLT 198 214   BMET  Basename 03/20/12 0355 03/19/12 1107  NA 137 136  K 3.6 3.3*  CL 103 100  CO2 26 26  GLUCOSE 135* 148*  BUN 7 8  CREATININE 0.84 0.91  CALCIUM 8.3* 8.9     Studies/Results: Dg Chest 2 View  03/19/2012  *RADIOLOGY REPORT*  Clinical Data: Postop appendectomy.  Nausea.  Possible fever. Lower abdominal pain.  CHEST - 2 VIEW  Comparison: None.  Findings: Mild posterior dependent lung base opacity is most consistent with atelectasis.  Infiltrate is not excluded but less likely.  The lungs are otherwise clear.  Normal heart, mediastinum and hila.  Dislocated right AC joint, likely chronic.  IMPRESSION: Mild lung base opacity most consistent with atelectasis. Infiltrate is possible but less likely.   Original Report Authenticated By: Domenic Moras, M.D.    Ct Abdomen Pelvis W Contrast  03/19/2012  *RADIOLOGY  REPORT*  Clinical Data: The knee.  Right lower quadrant pain, fever and nausea.  CT ABDOMEN AND PELVIS WITH CONTRAST  Technique:  Multidetector CT imaging of the abdomen and pelvis was performed following the standard protocol during bolus administration of intravenous contrast.  Contrast: OMNIPAQUE IOHEXOL 300 MG/ML  SOLN  Comparison: 03/15/2012  Findings: Since prior exam, of the inflamed appendix has been removed.  There is a small amount of ill-defined fluid adjacent to the cecal tip and along the retrocecal space leading to thickening of the lateral coronal fascia and anterior inferior pararenal fascia.  There is no defined abscess in this location.  In the pelvis, however, there is a defined fluid collection anterior to the rectosigmoid.  It measures 5.2 cm x 3.5 cm x 5.6 cm in size. This could reflect an abscess.  There is mild wall thickening of the terminal ileum beginning approximate 4 cm proximal to the ileocecal valve and extending for 5-6 cm in length.  This is likely reactive wall edema to adjacent inflammation.  There is no free air.  Mild increase stool is seen throughout the colon.  There are no other areas of bowel wall thickening.  The lung bases show small effusions with dependent atelectasis. The lungs otherwise clear and the heart is normal in size.  There is a small low density lesion in the posterior segment of the right lobe of the liver.  This  is likely a cyst.  It is stable.  The liver is otherwise unremarkable.  The spleen is mildly enlarged measuring 14.3 cm in greatest transverse dimension.  There is no splenic mass or focal lesion.  Normal gallbladder.  Normal pancreas.  No bile duct dilation.  No adrenal masses.  There is a nonobstructing stone in the mid pole of the left kidney.  This is stable.  Kidneys otherwise unremarkable.  Normal ureters and bladder.  No adenopathy.  Mild degenerative changes of the lower thoracic and lumbar spine.  No osteoblastic or osteolytic lesions.   IMPRESSION: There is ill-defined fluid attenuation and inflammatory stranding in the right lower quadrant, below the cecum and in the retrocecal space, but no defined collection in this location to suggest an abscess.  There is thickening of a portion of the terminal ileum that is likely reactive wall thickening to adjacent inflammation.  There is a defined fluid collection measuring 5.6 cm in greatest dimension in the posterior pelvic recess anterior to the rectosigmoid.  This could reflect an abscess.   Original Report Authenticated By: Domenic Moras, M.D.     Anti-infectives: Anti-infectives     Start     Dose/Rate Route Frequency Ordered Stop   03/19/12 1630   ciprofloxacin (CIPRO) IVPB 400 mg  Status:  Discontinued        400 mg 200 mL/hr over 60 Minutes Intravenous Every 12 hours 03/19/12 1619 03/19/12 1622   03/19/12 1630   metroNIDAZOLE (FLAGYL) IVPB 500 mg  Status:  Discontinued        500 mg 100 mL/hr over 60 Minutes Intravenous Every 8 hours 03/19/12 1619 03/19/12 1622   03/19/12 1400   ciprofloxacin (CIPRO) IVPB 400 mg        400 mg 200 mL/hr over 60 Minutes Intravenous Every 12 hours 03/19/12 1328     03/19/12 1400   metroNIDAZOLE (FLAGYL) IVPB 500 mg        500 mg 100 mL/hr over 60 Minutes Intravenous Every 8 hours 03/19/12 1328            Assessment/Plan: Status post laparoscopic appendectomy for perforated appendicitis and now with apparent pelvic abscess as well as some thickening of the bowel loops in the right lower quadrant with right lower quadrant tenderness as well. His white blood count has decreased today and pain is stable. Radiology has been contacted regarding possible percutaneous drainage. Continue current antibiotics.    LOS: 1 day    Lindsey Hommel T 03/20/2012

## 2012-03-20 NOTE — H&P (Signed)
Sean Aguirre is an 36 y.o. male.   Chief Complaint: Pelvic fluid collection HPI: 36 y/o M with persistent RLQ abdominal pain following appy.  CT demonstrates indeterminate fluid collection in pelvis.  Request made by surgical team to attempt CT guided aspiration/drain placement.  Pt admits to intermittent fevers.  No CP, SOB.  Past Medical History  Diagnosis Date  . Rectal pain   . Rectal abscess   . Asthma     as child only  . Right shoulder injury     Past Surgical History  Procedure Date  . Lasik 2003  . Wisdom tooth extraction   . Treatment fistula anal 04/20/11  . Anal fistula plug 08/12/2011  . Laparoscopic appendectomy 03/15/2012    Procedure: APPENDECTOMY LAPAROSCOPIC;  Surgeon: Shelly Rubenstein, MD;  Location: [facility];  Service: General;  Laterality: N/A;  . Appendectomy     History reviewed. No pertinent family history. Social History:  reports that he quit smoking about 5 years ago. His smoking use included Cigars. He has quit using smokeless tobacco. His smokeless tobacco use included Chew. He reports that he drinks about 1.2 ounces of alcohol per week. He reports that he does not use illicit drugs.  Allergies:  Allergies  Allergen Reactions  . Morphine And Related Other (See Comments)    Shaking chills, Rash.  Has tolerated Vicodin and dilaudid in past per pt.  . Penicillins Other (See Comments)    Happened in childhood, was told to avoid.     Medications Prior to Admission  Medication Sig Dispense Refill  . aspirin-acetaminophen-caffeine (EXCEDRIN MIGRAINE) 250-250-65 MG per tablet Take 2 tablets by mouth every 6 (six) hours as needed. headache      . ciprofloxacin (CIPRO) 500 MG tablet Take 1 tablet (500 mg total) by mouth 2 (two) times daily.  10 tablet  0  . fexofenadine (ALLEGRA) 30 MG tablet Take 30 mg by mouth daily. allergies      . ibuprofen (ADVIL,MOTRIN) 200 MG tablet Take 400 mg by mouth every 6 (six) hours as needed. pain      . metroNIDAZOLE  (FLAGYL) 500 MG tablet Take 1 tablet (500 mg total) by mouth every 8 (eight) hours.  15 tablet  0  . Multiple Vitamin (MULITIVITAMIN WITH MINERALS) TABS Take 1 tablet by mouth daily.      Marland Kitchen oxyCODONE-acetaminophen (PERCOCET/ROXICET) 5-325 MG per tablet Take 1-2 tablets by mouth every 4 (four) hours as needed. pain      . Simethicone (GAS-X EXTRA STRENGTH) 125 MG CAPS Take 1 capsule by mouth once.         Results for orders placed during the hospital encounter of 03/19/12 (from the past 48 hour(s))  CBC WITH DIFFERENTIAL     Status: Abnormal   Collection Time   03/19/12 11:07 AM      Component Value Range Comment   WBC 10.9 (*) 4.0 - 10.5 K/uL    RBC 4.92  4.22 - 5.81 MIL/uL    Hemoglobin 13.3  13.0 - 17.0 g/dL    HCT 52.8 (*) 41.3 - 52.0 %    MCV 77.8 (*) 78.0 - 100.0 fL    MCH 27.0  26.0 - 34.0 pg    MCHC 34.7  30.0 - 36.0 g/dL    RDW 24.4  01.0 - 27.2 %    Platelets 214  150 - 400 K/uL    Neutrophils Relative 81 (*) 43 - 77 %    Neutro Abs 8.8 (*) 1.7 -  7.7 K/uL    Lymphocytes Relative 10 (*) 12 - 46 %    Lymphs Abs 1.1  0.7 - 4.0 K/uL    Monocytes Relative 8  3 - 12 %    Monocytes Absolute 0.9  0.1 - 1.0 K/uL    Eosinophils Relative 1  0 - 5 %    Eosinophils Absolute 0.1  0.0 - 0.7 K/uL    Basophils Relative 0  0 - 1 %    Basophils Absolute 0.0  0.0 - 0.1 K/uL   COMPREHENSIVE METABOLIC PANEL     Status: Abnormal   Collection Time   03/19/12 11:07 AM      Component Value Range Comment   Sodium 136  135 - 145 mEq/L    Potassium 3.3 (*) 3.5 - 5.1 mEq/L    Chloride 100  96 - 112 mEq/L    CO2 26  19 - 32 mEq/L    Glucose, Bld 148 (*) 70 - 99 mg/dL    BUN 8  6 - 23 mg/dL    Creatinine, Ser 0.45  0.50 - 1.35 mg/dL    Calcium 8.9  8.4 - 40.9 mg/dL    Total Protein 7.2  6.0 - 8.3 g/dL    Albumin 3.2 (*) 3.5 - 5.2 g/dL    AST 20  0 - 37 U/L    ALT 18  0 - 53 U/L    Alkaline Phosphatase 115  39 - 117 U/L    Total Bilirubin 0.9  0.3 - 1.2 mg/dL    GFR calc non Af Amer >90  >90  mL/min    GFR calc Af Amer >90  >90 mL/min   URINALYSIS, ROUTINE W REFLEX MICROSCOPIC     Status: Normal   Collection Time   03/19/12 11:43 AM      Component Value Range Comment   Color, Urine YELLOW  YELLOW    APPearance CLEAR  CLEAR    Specific Gravity, Urine 1.012  1.005 - 1.030    pH 6.5  5.0 - 8.0    Glucose, UA NEGATIVE  NEGATIVE mg/dL    Hgb urine dipstick NEGATIVE  NEGATIVE    Bilirubin Urine NEGATIVE  NEGATIVE    Ketones, ur NEGATIVE  NEGATIVE mg/dL    Protein, ur NEGATIVE  NEGATIVE mg/dL    Urobilinogen, UA 0.2  0.0 - 1.0 mg/dL    Nitrite NEGATIVE  NEGATIVE    Leukocytes, UA NEGATIVE  NEGATIVE MICROSCOPIC NOT DONE ON URINES WITH NEGATIVE PROTEIN, BLOOD, LEUKOCYTES, NITRITE, OR GLUCOSE <1000 mg/dL.  PROTIME-INR     Status: Normal   Collection Time   03/19/12  4:59 PM      Component Value Range Comment   Prothrombin Time 13.6  11.6 - 15.2 seconds    INR 1.05  0.00 - 1.49   BASIC METABOLIC PANEL     Status: Abnormal   Collection Time   03/20/12  3:55 AM      Component Value Range Comment   Sodium 137  135 - 145 mEq/L    Potassium 3.6  3.5 - 5.1 mEq/L    Chloride 103  96 - 112 mEq/L    CO2 26  19 - 32 mEq/L    Glucose, Bld 135 (*) 70 - 99 mg/dL    BUN 7  6 - 23 mg/dL    Creatinine, Ser 8.11  0.50 - 1.35 mg/dL    Calcium 8.3 (*) 8.4 - 10.5 mg/dL    GFR calc non  Af Amer >90  >90 mL/min    GFR calc Af Amer >90  >90 mL/min   CBC     Status: Abnormal   Collection Time   03/20/12  3:55 AM      Component Value Range Comment   WBC 8.1  4.0 - 10.5 K/uL    RBC 4.45  4.22 - 5.81 MIL/uL    Hemoglobin 11.7 (*) 13.0 - 17.0 g/dL    HCT 62.9 (*) 52.8 - 52.0 %    MCV 78.0  78.0 - 100.0 fL    MCH 26.3  26.0 - 34.0 pg    MCHC 33.7  30.0 - 36.0 g/dL    RDW 41.3  24.4 - 01.0 %    Platelets 198  150 - 400 K/uL   PROTIME-INR     Status: Normal   Collection Time   03/20/12  3:55 AM      Component Value Range Comment   Prothrombin Time 13.8  11.6 - 15.2 seconds    INR 1.07  0.00 -  1.49    Dg Chest 2 View  03/19/2012  *RADIOLOGY REPORT*  Clinical Data: Postop appendectomy.  Nausea.  Possible fever. Lower abdominal pain.  CHEST - 2 VIEW  Comparison: None.  Findings: Mild posterior dependent lung base opacity is most consistent with atelectasis.  Infiltrate is not excluded but less likely.  The lungs are otherwise clear.  Normal heart, mediastinum and hila.  Dislocated right AC joint, likely chronic.  IMPRESSION: Mild lung base opacity most consistent with atelectasis. Infiltrate is possible but less likely.   Original Report Authenticated By: Domenic Moras, M.D.    Ct Abdomen Pelvis W Contrast  03/19/2012  *RADIOLOGY REPORT*  Clinical Data: The knee.  Right lower quadrant pain, fever and nausea.  CT ABDOMEN AND PELVIS WITH CONTRAST  Technique:  Multidetector CT imaging of the abdomen and pelvis was performed following the standard protocol during bolus administration of intravenous contrast.  Contrast: OMNIPAQUE IOHEXOL 300 MG/ML  SOLN  Comparison: 03/15/2012  Findings: Since prior exam, of the inflamed appendix has been removed.  There is a small amount of ill-defined fluid adjacent to the cecal tip and along the retrocecal space leading to thickening of the lateral coronal fascia and anterior inferior pararenal fascia.  There is no defined abscess in this location.  In the pelvis, however, there is a defined fluid collection anterior to the rectosigmoid.  It measures 5.2 cm x 3.5 cm x 5.6 cm in size. This could reflect an abscess.  There is mild wall thickening of the terminal ileum beginning approximate 4 cm proximal to the ileocecal valve and extending for 5-6 cm in length.  This is likely reactive wall edema to adjacent inflammation.  There is no free air.  Mild increase stool is seen throughout the colon.  There are no other areas of bowel wall thickening.  The lung bases show small effusions with dependent atelectasis. The lungs otherwise clear and the heart is normal in  size.  There is a small low density lesion in the posterior segment of the right lobe of the liver.  This is likely a cyst.  It is stable.  The liver is otherwise unremarkable.  The spleen is mildly enlarged measuring 14.3 cm in greatest transverse dimension.  There is no splenic mass or focal lesion.  Normal gallbladder.  Normal pancreas.  No bile duct dilation.  No adrenal masses.  There is a nonobstructing stone in the mid pole  of the left kidney.  This is stable.  Kidneys otherwise unremarkable.  Normal ureters and bladder.  No adenopathy.  Mild degenerative changes of the lower thoracic and lumbar spine.  No osteoblastic or osteolytic lesions.  IMPRESSION: There is ill-defined fluid attenuation and inflammatory stranding in the right lower quadrant, below the cecum and in the retrocecal space, but no defined collection in this location to suggest an abscess.  There is thickening of a portion of the terminal ileum that is likely reactive wall thickening to adjacent inflammation.  There is a defined fluid collection measuring 5.6 cm in greatest dimension in the posterior pelvic recess anterior to the rectosigmoid.  This could reflect an abscess.   Original Report Authenticated By: Domenic Moras, M.D.     Review of Systems  Constitutional: Positive for fever, chills and malaise/fatigue.  Respiratory: Negative.   Cardiovascular: Negative.   Gastrointestinal: Positive for abdominal pain.  Genitourinary: Negative.     Blood pressure 117/78, pulse 80, temperature 98.1 F (36.7 C), temperature source Oral, resp. rate 18, height 5\' 11"  (1.803 m), weight 182 lb (82.555 kg), SpO2 96.00%. Physical Exam  Constitutional: He appears well-developed and well-nourished.  Cardiovascular: Normal heart sounds.   Respiratory: Effort normal.  GI: There is tenderness.       Right lower quadrant abdominal pain.  Psychiatric: He has a normal mood and affect.     Assessment/Plan 36 y/o M with pelvic fluid  collection following recent appy.   Informed written consent for CT guided aspiration/drain placement of pelvic fluid collection via TG approach obtained from pt in presence of his wife, after discussion of benefits and risks (including but not limited to bleeding, infection, injury to adjacent organs).  Simonne Come 03/20/2012, 10:06 AM

## 2012-03-20 NOTE — Plan of Care (Signed)
Problem: Phase I Progression Outcomes Goal: OOB as tolerated unless otherwise ordered Outcome: Completed/Met Date Met:  03/20/12 Encouraged to call for assistance with verbalized understanding.

## 2012-03-21 MED ORDER — HEPARIN SODIUM (PORCINE) 5000 UNIT/ML IJ SOLN
5000.0000 [IU] | Freq: Three times a day (TID) | INTRAMUSCULAR | Status: DC
  Administered 2012-03-21 – 2012-03-22 (×3): 5000 [IU] via SUBCUTANEOUS
  Filled 2012-03-21 (×6): qty 1

## 2012-03-21 NOTE — Progress Notes (Signed)
Doing OK, still pretty sore.  D/C when able to control pain.

## 2012-03-21 NOTE — Care Management Note (Addendum)
    Page 1 of 1   03/22/2012     11:19:03 AM   CARE MANAGEMENT NOTE 03/22/2012  Patient:  Sean Aguirre, Sean Aguirre   Account Number:  [id number]  Date Initiated:  03/21/2012  Documentation initiated by:  Lorenda Ishihara  Subjective/Objective Assessment:   36 yo male admitted with abscess s/p Appy 9-17. PTA lived at home with spouse.     Action/Plan:   Perc drain by IR, teach drain care, pain control   Anticipated DC Date:  03/24/2012   Anticipated DC Plan:  HOME/SELF CARE      DC Planning Services  CM consult      Choice offered to / List presented to:             Status of service:  Completed, signed off Medicare Important Message given?   (If response is "NO", the following Medicare IM given date fields will be blank) Date Medicare IM given:   Date Additional Medicare IM given:    Discharge Disposition:  HOME/SELF CARE  Per UR Regulation:  Reviewed for med. necessity/level of care/duration of stay  If discussed at Long Length of Stay Meetings, dates discussed:    Comments:  03-22-12 Lorenda Ishihara RN CM 1100 Recieved referral for [facility] RN for wound/drain care. Spoke with patient and wife at bedside, they feel they can manage the drain themselves with a little teaching from the staff. Patient states he has been flushing and draining it since insertion but is unable to reach the site for dressing changes. Wife states she is comfortable with dressing changes. Spoke with RN who will provide instruction on care of wound/drain and if wife and patient are still comfortable will defer [facility] services. Also discussed with Will Marlyne Beards PA.

## 2012-03-21 NOTE — Progress Notes (Signed)
Subjective: Feeling pretty good, still sore taking some dilaudid for pain yesterday and this AM. Pain is mostly on the left side now.  Before the drain it was in the RLQ.  Objective: Vital signs in last 24 hours: Temp:  [97.9 F (36.6 C)-98.3 F (36.8 C)] 98 F (36.7 C) (09/23 0617) Pulse Rate:  [68-87] 80  (09/23 0617) Resp:  [9-20] 18  (09/23 0617) BP: (99-121)/(59-78) 99/64 mmHg (09/23 0617) SpO2:  [93 %-100 %] 93 % (09/23 0617) Last BM Date: 03/18/12  360 po recorded yesterday. Regular diet, afebrile, VSS, no labs today  Intake/Output from previous day: 09/22 0701 - 09/23 0700 In: 3821 [P.O.:360; I.V.:2646; IV Piggyback:800] Out: 1600 [Urine:1600] Intake/Output this shift:    General appearance: alert, cooperative and no distress GI: soft, a little tender LLQ, drainage from perc drain is serosanguneous.  sites from prior appendectomy look fine.  Lab Results:   Basename 03/20/12 0355 03/19/12 1107  WBC 8.1 10.9*  HGB 11.7* 13.3  HCT 34.7* 38.3*  PLT 198 214    BMET  Basename 03/20/12 0355 03/19/12 1107  NA 137 136  K 3.6 3.3*  CL 103 100  CO2 26 26  GLUCOSE 135* 148*  BUN 7 8  CREATININE 0.84 0.91  CALCIUM 8.3* 8.9   PT/INR  Basename 03/20/12 0355 03/19/12 1659  LABPROT 13.8 13.6  INR 1.07 1.05     Lab 03/19/12 1107 03/15/12 1125  AST 20 17  ALT 18 22  ALKPHOS 115 60  BILITOT 0.9 0.8  PROT 7.2 7.6  ALBUMIN 3.2* 4.4     Lipase     Component Value Date/Time   LIPASE 26 03/15/2012 1125     Studies/Results: Dg Chest 2 View  03/19/2012  *RADIOLOGY REPORT*  Clinical Data: Postop appendectomy.  Nausea.  Possible fever. Lower abdominal pain.  CHEST - 2 VIEW  Comparison: None.  Findings: Mild posterior dependent lung base opacity is most consistent with atelectasis.  Infiltrate is not excluded but less likely.  The lungs are otherwise clear.  Normal heart, mediastinum and hila.  Dislocated right AC joint, likely chronic.  IMPRESSION: Mild lung  base opacity most consistent with atelectasis. Infiltrate is possible but less likely.   Original Report Authenticated By: Domenic Moras, M.D.    Ct Guided Abscess Drain  03/20/2012  *RADIOLOGY REPORT*  Indication: Pelvic fluid collection/abscess post appendectomy  CT GUIDED LEFT TRANSGLUTEAL DRAINAGE CATHETER PLACEMENT  Comparison: CT abdomen pelvis - 03/19/2012; 03/15/2012  Medications: Fentanyl 200 mcg IV; Versed 4 mg IV  Total Moderate Sedation time: 15 minutes  Contrast: None  Complications: None immediate  Technique / Findings:  Informed written consent was obtained from the patient after a discussion of the risks, benefits and alternatives to treatment. The patient was placed prone, slightly RAO on the CT gantry and a pre procedural CT was performed re-demonstrating the known approximately 4.0 x 5.2 cm abscess/fluid collection within the pelvis.  The procedure was planned.   A timeout was performed prior to the initiation of the procedure.  The left buttocks prepped and draped in the usual sterile fashion. The overlying soft tissues were anesthetized with 1% lidocaine with epinephrine. Appropriate trajectory was planned with the use of 22 gauge spinal needle.  An 18 gauge trocar needle was advanced in to the abscess/fluid collection and a short Amplatz super stiff wire was coiled within the abscess/fluid collection.   Appropriate positioning was confirmed with a limited CT scan.  The tract was serially dilated  allowing placement of a 10 [place] all-purpose drainage catheter.  Appropriate positioning was confirmed with a limited postprocedural CT scan.  10 ml of bloody fluid was aspirated.  The tube was connected to a drainage bag and sutured in place.  A dressing was placed.  The patient tolerated the procedure well without immediate post procedural complication.  Impression:  Successful CT guided placement of a 10 [place] all purpose drain catheter into the pelvis via left transgluteal approach with  aspiration of 10 mL of bloody fluid.  Samples were sent to the laboratory as requested by the ordering clinical team.   Original Report Authenticated By: Waynard Reeds, M.D.    Ct Abdomen Pelvis W Contrast  03/19/2012  *RADIOLOGY REPORT*  Clinical Data: The knee.  Right lower quadrant pain, fever and nausea.  CT ABDOMEN AND PELVIS WITH CONTRAST  Technique:  Multidetector CT imaging of the abdomen and pelvis was performed following the standard protocol during bolus administration of intravenous contrast.  Contrast: OMNIPAQUE IOHEXOL 300 MG/ML  SOLN  Comparison: 03/15/2012  Findings: Since prior exam, of the inflamed appendix has been removed.  There is a small amount of ill-defined fluid adjacent to the cecal tip and along the retrocecal space leading to thickening of the lateral coronal fascia and anterior inferior pararenal fascia.  There is no defined abscess in this location.  In the pelvis, however, there is a defined fluid collection anterior to the rectosigmoid.  It measures 5.2 cm x 3.5 cm x 5.6 cm in size. This could reflect an abscess.  There is mild wall thickening of the terminal ileum beginning approximate 4 cm proximal to the ileocecal valve and extending for 5-6 cm in length.  This is likely reactive wall edema to adjacent inflammation.  There is no free air.  Mild increase stool is seen throughout the colon.  There are no other areas of bowel wall thickening.  The lung bases show small effusions with dependent atelectasis. The lungs otherwise clear and the heart is normal in size.  There is a small low density lesion in the posterior segment of the right lobe of the liver.  This is likely a cyst.  It is stable.  The liver is otherwise unremarkable.  The spleen is mildly enlarged measuring 14.3 cm in greatest transverse dimension.  There is no splenic mass or focal lesion.  Normal gallbladder.  Normal pancreas.  No bile duct dilation.  No adrenal masses.  There is a nonobstructing stone in  the mid pole of the left kidney.  This is stable.  Kidneys otherwise unremarkable.  Normal ureters and bladder.  No adenopathy.  Mild degenerative changes of the lower thoracic and lumbar spine.  No osteoblastic or osteolytic lesions.  IMPRESSION: There is ill-defined fluid attenuation and inflammatory stranding in the right lower quadrant, below the cecum and in the retrocecal space, but no defined collection in this location to suggest an abscess.  There is thickening of a portion of the terminal ileum that is likely reactive wall thickening to adjacent inflammation.  There is a defined fluid collection measuring 5.6 cm in greatest dimension in the posterior pelvic recess anterior to the rectosigmoid.  This could reflect an abscess.   Original Report Authenticated By: Domenic Moras, M.D.     Medications:    . ciprofloxacin  400 mg Intravenous Q12H  . fentaNYL      . metronidazole  500 mg Intravenous Q8H  . midazolam  Assessment/Plan S/p appendectomy 9/17 now with pelvic abscess. Dr. Magnus Ivan;  IR drain placed 03/20/12. 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   Plan:  He's tolerating full diet, will mobilize, and see how he does.  Home when he can do without IV pain med.. Teach drain care today. Switch to PO antibiotics at discharge.  Follow up with Dr. Magnus Ivan in 1 week. Recheck cbc in AM if he is still here.  LOS: 2 days    Deseray Daponte 03/21/2012

## 2012-03-22 ENCOUNTER — Encounter (HOSPITAL_COMMUNITY): Payer: Self-pay | Admitting: General Surgery

## 2012-03-22 LAB — CBC
MCH: 26.5 pg (ref 26.0–34.0)
MCHC: 33.7 g/dL (ref 30.0–36.0)
MCV: 78.5 fL (ref 78.0–100.0)
Platelets: 267 10*3/uL (ref 150–400)
RBC: 4.46 MIL/uL (ref 4.22–5.81)
RDW: 14.4 % (ref 11.5–15.5)

## 2012-03-22 MED ORDER — OXYCODONE-ACETAMINOPHEN 5-325 MG PO TABS
1.0000 | ORAL_TABLET | ORAL | Status: DC | PRN
Start: 2012-03-22 — End: 2012-04-12

## 2012-03-22 MED ORDER — METRONIDAZOLE 500 MG PO TABS
500.0000 mg | ORAL_TABLET | Freq: Three times a day (TID) | ORAL | Status: DC
  Filled 2012-03-22 (×3): qty 1

## 2012-03-22 MED ORDER — CIPROFLOXACIN HCL 500 MG PO TABS
500.0000 mg | ORAL_TABLET | Freq: Two times a day (BID) | ORAL | Status: DC
  Filled 2012-03-22 (×3): qty 1

## 2012-03-22 MED ORDER — CIPROFLOXACIN HCL 500 MG PO TABS: 500.0000 mg | ORAL_TABLET | Freq: Two times a day (BID) | ORAL | Status: DC

## 2012-03-22 MED ORDER — METRONIDAZOLE 500 MG PO TABS: 500.0000 mg | ORAL_TABLET | Freq: Three times a day (TID) | ORAL | Status: DC

## 2012-03-22 NOTE — Progress Notes (Signed)
Patient discharged by Lauris Poag, RN [facility]. IV removed and discharge instructions given per her

## 2012-03-22 NOTE — Progress Notes (Signed)
Subjective: Still a bit sore in lower abdomen, but feels much better.  Objective: Vital signs in last 24 hours: Temp:  [97.9 F (36.6 C)-98.4 F (36.9 C)] 97.9 F (36.6 C) (09/24 0540) Pulse Rate:  [64-76] 72  (09/24 0540) Resp:  [16-18] 16  (09/24 0540) BP: (102-109)/(64-67) 108/67 mmHg (09/24 0540) SpO2:  [95 %-97 %] 95 % (09/24 0540) Last BM Date: 03/21/12.  Nothing recorded from drain. Diet: regular, afebrile, VSS, WBC 8.3. No growth from drain culture so far.  Intake/Output from previous day: 09/23 0701 - 09/24 0700 In: 1687.3 [P.O.:360; I.V.:617.3; IV Piggyback:700] Out: 1700 [Urine:1700] Intake/Output this shift:    General appearance: alert, cooperative and no distress GI: soft, not really tender, more sore lower abdomen, better, eating and tolerating PO's well.  Incisions are healing nicely.  Drain site looks fine.  Clear serosanguineous drainage is in the bag.  Nothing recorded yesterday.  Lab Results:   Basename 03/22/12 0405 03/20/12 0355  WBC 8.3 8.1  HGB 11.8* 11.7*  HCT 35.0* 34.7*  PLT 267 198    BMET  Basename 03/20/12 0355 03/19/12 1107  NA 137 136  K 3.6 3.3*  CL 103 100  CO2 26 26  GLUCOSE 135* 148*  BUN 7 8  CREATININE 0.84 0.91  CALCIUM 8.3* 8.9   PT/INR  Basename 03/20/12 0355 03/19/12 1659  LABPROT 13.8 13.6  INR 1.07 1.05     Lab 03/19/12 1107 03/15/12 1125  AST 20 17  ALT 18 22  ALKPHOS 115 60  BILITOT 0.9 0.8  PROT 7.2 7.6  ALBUMIN 3.2* 4.4     Lipase     Component Value Date/Time   LIPASE 26 03/15/2012 1125     Studies/Results: Ct Guided Abscess Drain  03/20/2012  *RADIOLOGY REPORT*  Indication: Pelvic fluid collection/abscess post appendectomy  CT GUIDED LEFT TRANSGLUTEAL DRAINAGE CATHETER PLACEMENT  Comparison: CT abdomen pelvis - 03/19/2012; 03/15/2012  Medications: Fentanyl 200 mcg IV; Versed 4 mg IV  Total Moderate Sedation time: 15 minutes  Contrast: None  Complications: None immediate  Technique / Findings:   Informed written consent was obtained from the patient after a discussion of the risks, benefits and alternatives to treatment. The patient was placed prone, slightly RAO on the CT gantry and a pre procedural CT was performed re-demonstrating the known approximately 4.0 x 5.2 cm abscess/fluid collection within the pelvis.  The procedure was planned.   A timeout was performed prior to the initiation of the procedure.  The left buttocks prepped and draped in the usual sterile fashion. The overlying soft tissues were anesthetized with 1% lidocaine with epinephrine. Appropriate trajectory was planned with the use of 22 gauge spinal needle.  An 18 gauge trocar needle was advanced in to the abscess/fluid collection and a short Amplatz super stiff wire was coiled within the abscess/fluid collection.   Appropriate positioning was confirmed with a limited CT scan.  The tract was serially dilated allowing placement of a 10 [place] all-purpose drainage catheter.  Appropriate positioning was confirmed with a limited postprocedural CT scan.  10 ml of bloody fluid was aspirated.  The tube was connected to a drainage bag and sutured in place.  A dressing was placed.  The patient tolerated the procedure well without immediate post procedural complication.  Impression:  Successful CT guided placement of a 10 [place] all purpose drain catheter into the pelvis via left transgluteal approach with aspiration of 10 mL of bloody fluid.  Samples were sent to the laboratory  as requested by the ordering clinical team.   Original Report Authenticated By: Waynard Reeds, M.D.     Medications:    . ciprofloxacin  400 mg Intravenous Q12H  . heparin subcutaneous  5,000 Units Subcutaneous Q8H  . metronidazole  500 mg Intravenous Q8H    Assessment/Plan   Plan:  Home on PO antibiotics, follow up with Dr. Magnus Ivan next week. Home health to help with drain care.   LOS: 3 days    JENNINGS,WILLARD 03/22/2012  Agree with above  Home  today with PO Abx F/u with Dr. Curly Rim, MD [facility] General & Minimally Invasive Surgery Trauma & Emergency Surgery

## 2012-03-22 NOTE — Progress Notes (Signed)
Discharge teaching done for drain care. Wife and patient demonstrated drain care. Understood about measuring output, flushes, and s/s of infection. Also to monitor for temperature twice a day.  Denies any need for home health. Rx and note for work given as well as supplies for drain care. No concerns voiced at discharge.

## 2012-03-22 NOTE — Progress Notes (Signed)
Drain care teaching performed by Lauris Poag, [facility] states family does not want HH and case management informed.

## 2012-03-22 NOTE — Discharge Summary (Signed)
Physician Discharge Summary  Patient ID: Sean Aguirre MRN: [id number] DOB/AGE: 1976-05-06 36 y.o.  Admit date: 03/19/2012 Discharge date: 03/22/2012  Admission Diagnoses: S/p appendectomy 9/17 now with pelvic abscess. Dr. Magnus Ivan; IR drain placed 03/20/12.  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   Discharge Diagnoses: Same Principal Problem:  *Intra-abdominal abscess post-procedure   PROCEDURES: CT GUIDED ABSCESS DRAIN, Simonne Come, MD. 03/20/2012    Hospital Course: this patient is known to our service for a recent laparoscopic appendectomy earlier this week.(03/15/12 DR. Magnus Ivan.)  He did well from his surgery and progressed each day and was feeling better at home until he began having right-sided abdominal pain yesterday. He says that the pain has persisted until this morning and he called asking for directions. I recommended that he come in to the emergency room for evaluation and CT scans obtained in the emergency room was consistent with a 5 cm fluid collection in the pelvis consistent with an abscess. He also has some inflammatory changes around his cecum and terminal ileum but no obvious leak or free fluid or free air. He has had chills but no measured fevers. He also has had persistent nausea to go along with the abdominal pain as well as some abdominal bloating and distention. He's had diarrhea. He was readmitted by Dr. Biagio Quint, and place on IV antibiotics, he had more pain across lower abdomen the next day, he was seen by Dr. Johna Sheriff and sent to radiology where he underwent: Successful placement of a 10 French drain into the abscess/fluid collection within the left hemipelvis via TG approach.  Approximately 20 mL of bloody fluid aspirated.  Samples sent to lab as requested. No growth from cultures at this time.  He has had a clear serosanguineous drainage, no fever, and normal WBC.  Pain is controled with PO analgesics.  Still sore in lower  abdomen, but much better than before.  It was Dr. Arita Miss opinion he could go home on antibiotics, with the drain.  He will see Dr.Blackman next week.  Drain to be flushed BID.  Home health has been requested to help with that.Continuing flagyl and Cipro PO at home.  I have given him a 10 day supply of antibiotics.    Disposition: 01-Home or Self Care      Discharge Orders    Future Appointments: Provider: Department: Dept Phone: Center:   03/29/2012 2:30 PM [facility] [phone] None   03/30/2012 4:40 PM Shelly Rubenstein, MD [facility] [phone] None       Medication List     As of 03/22/2012  8:46 AM    TAKE these medications         aspirin-acetaminophen-caffeine 250-250-65 MG per tablet   Commonly known as: EXCEDRIN MIGRAINE   Take 2 tablets by mouth every 6 (six) hours as needed. headache      ciprofloxacin 500 MG tablet   Commonly known as: CIPRO   Take 1 tablet (500 mg total) by mouth 2 (two) times daily.      fexofenadine 30 MG tablet   Commonly known as: ALLEGRA   Take 30 mg by mouth daily. allergies      GAS-X EXTRA STRENGTH 125 MG Caps   Generic drug: Simethicone   Take 1 capsule by mouth once.      ibuprofen 200 MG tablet   Commonly known as: ADVIL,MOTRIN   Take 400 mg by mouth every 6 (six) hours as  needed. pain      metroNIDAZOLE 500 MG tablet   Commonly known as: FLAGYL   Take 1 tablet (500 mg total) by mouth every 8 (eight) hours.      multivitamin with minerals Tabs   Take 1 tablet by mouth daily.      oxyCODONE-acetaminophen 5-325 MG per tablet   Commonly known as: PERCOCET/ROXICET   Take 1-2 tablets by mouth every 4 (four) hours as needed. pain      oxyCODONE-acetaminophen 5-325 MG per tablet   Commonly known as: PERCOCET/ROXICET   Take 1-2 tablets by mouth every 4 (four) hours as needed.         Follow-up Information    Follow up with [facility] A, MD. On 03/30/2012. (New Appointment:  Be at  office at 4:20 PM.  Call if you have a problem sooner.)    Contact information:   [address] [place] [address] [phone]       Please follow up. (Please excuse from Work for the next two weeks thru 04/03/12.  )          Signed: Sherrie George 03/22/2012, 8:46 AM   Axel Filler, MD [facility] General & Minimally Invasive Surgery Trauma & Emergency Surgery

## 2012-03-23 LAB — CULTURE, ROUTINE-ABSCESS: Culture: NO GROWTH

## 2012-03-25 ENCOUNTER — Telehealth (INDEPENDENT_AMBULATORY_CARE_PROVIDER_SITE_OTHER): Payer: Self-pay | Admitting: General Surgery

## 2012-03-25 NOTE — Telephone Encounter (Signed)
Pt called to report he noted old, dark-red blood in his bowel movement today.  He is free of pain and fever.  Overall is doing very well at home now, using pain meds mostly at night.  He will call back is blood in stool worsens or other new symptoms appear before his OV next week.

## 2012-03-28 ENCOUNTER — Telehealth (INDEPENDENT_AMBULATORY_CARE_PROVIDER_SITE_OTHER): Payer: Self-pay

## 2012-03-28 NOTE — Telephone Encounter (Signed)
Patient called in asking if he needed a CT before his appointment on Wednesday for drain pull. I told patient that typically we don't order one unless there is a complication. He states that he seems to be doing fine with the drain and it is working okay. I told him we will follow up with him on Wednesday and if doctor needed to order anything he would do so on Wednesday. Patient understood.

## 2012-03-29 ENCOUNTER — Encounter (INDEPENDENT_AMBULATORY_CARE_PROVIDER_SITE_OTHER): Payer: Managed Care, Other (non HMO)

## 2012-03-30 ENCOUNTER — Ambulatory Visit (INDEPENDENT_AMBULATORY_CARE_PROVIDER_SITE_OTHER): Payer: Managed Care, Other (non HMO) | Admitting: Surgery

## 2012-03-30 ENCOUNTER — Encounter (INDEPENDENT_AMBULATORY_CARE_PROVIDER_SITE_OTHER): Payer: Self-pay | Admitting: Surgery

## 2012-03-30 VITALS — BP 122/80 | HR 84 | Temp 97.2°F | Resp 20 | Ht 71.0 in | Wt 173.0 lb

## 2012-03-30 DIAGNOSIS — Z09 Encounter for follow-up examination after completed treatment for conditions other than malignant neoplasm: Secondary | ICD-10-CM

## 2012-03-30 NOTE — Progress Notes (Signed)
Subjective:     Patient ID: Sean Aguirre, male   DOB: January 21, 1976, 36 y.o.   MRN: [id number]  HPI He is here for his first postoperative visit status post laparoscopic appendectomy.  His course was compensated by postoperative pelvic abscess requiring a drain. He still has a drain in place which is draining minimal fluid. He remains on Cipro and Flagyl. He is still having some abdominal discomfort but no fevers  Review of Systems     Objective:   Physical Exam On exam, his abdomen is soft with minimal tenderness. I went ahead and removed the drain    Assessment:     Patient status post lap appendectomy with postop abscess    Plan:     I'm going to continue the Cipro to stop the Flagyl as I believe it is irritating her stomach. He will continue the Cipro for a total of 15 more days. He is returning to work short time. I will see him back in one week

## 2012-04-07 ENCOUNTER — Encounter (INDEPENDENT_AMBULATORY_CARE_PROVIDER_SITE_OTHER): Payer: Self-pay | Admitting: Surgery

## 2012-04-07 ENCOUNTER — Other Ambulatory Visit (INDEPENDENT_AMBULATORY_CARE_PROVIDER_SITE_OTHER): Payer: Self-pay | Admitting: Surgery

## 2012-04-07 ENCOUNTER — Ambulatory Visit (INDEPENDENT_AMBULATORY_CARE_PROVIDER_SITE_OTHER): Payer: Managed Care, Other (non HMO) | Admitting: Surgery

## 2012-04-07 VITALS — BP 123/81 | HR 82 | Temp 98.6°F | Resp 18 | Ht 71.0 in | Wt 176.8 lb

## 2012-04-07 DIAGNOSIS — Z09 Encounter for follow-up examination after completed treatment for conditions other than malignant neoplasm: Secondary | ICD-10-CM

## 2012-04-07 DIAGNOSIS — K37 Unspecified appendicitis: Secondary | ICD-10-CM

## 2012-04-07 NOTE — Progress Notes (Signed)
Subjective:     Patient ID: Sean Aguirre, male   DOB: [birth year], 36 y.o.   MRN: [id number]  HPI He is now over 3 weeks out status post laparoscopic appendectomy for acute appendicitis compensated by postop pelvic abscess requiring CT-guided drainage. I removed the drain last week and stopped his Flagyl but kept him on Cipro. He is still having significant right flank and abdominal pain. He has no nausea or vomiting. His bowels are moving well.  Review of Systems     Objective:   Physical Exam On exam, his abdomen is soft but there is some tenderness with guarding along the right flank.    Assessment:     Patient status post laparoscopic appendectomy    Plan:     I believe he should be more improved by now. I am worried he could be developing another abscess. I want to get a CAT scan of his abdomen and pelvis with contrast to evaluate for drainable fluid collection. I will see him back after the CAT scan is complete.    He is also having some difficulty again with his anal fistula. I will discuss this with Dr. Johna Sheriff for his opinion. I may need to refer her to Dr. Maisie Fus

## 2012-04-11 ENCOUNTER — Telehealth (INDEPENDENT_AMBULATORY_CARE_PROVIDER_SITE_OTHER): Payer: Self-pay

## 2012-04-11 NOTE — Telephone Encounter (Signed)
Patient called to report an increase of lower and upper abdominal pain, nausea & vomiting.  Patient denies having any fevers.  Patient last seen on 04/07/12 with  Dr. Magnus Ivan.  CT scheduled on 04/12/12.  Reviewed with Dr. Magnus Ivan, patient advised to go to the Emergency Room for further evaluation and work up.

## 2012-04-12 ENCOUNTER — Encounter (HOSPITAL_COMMUNITY): Payer: Self-pay | Admitting: General Practice

## 2012-04-12 ENCOUNTER — Ambulatory Visit: Payer: Managed Care, Other (non HMO)

## 2012-04-12 ENCOUNTER — Observation Stay (HOSPITAL_COMMUNITY)
Admission: AD | Admit: 2012-04-12 | Discharge: 2012-04-13 | DRG: 948 | Disposition: A | Discharge status: Home / Self Care | Payer: Managed Care, Other (non HMO) | Source: Ambulatory Visit | Attending: Surgery | Admitting: Surgery

## 2012-04-12 ENCOUNTER — Ambulatory Visit (INDEPENDENT_AMBULATORY_CARE_PROVIDER_SITE_OTHER): Payer: Managed Care, Other (non HMO) | Admitting: Surgery

## 2012-04-12 ENCOUNTER — Inpatient Hospital Stay (HOSPITAL_COMMUNITY): Payer: Managed Care, Other (non HMO)

## 2012-04-12 ENCOUNTER — Encounter (INDEPENDENT_AMBULATORY_CARE_PROVIDER_SITE_OTHER): Payer: Self-pay | Admitting: Surgery

## 2012-04-12 ENCOUNTER — Telehealth (INDEPENDENT_AMBULATORY_CARE_PROVIDER_SITE_OTHER): Payer: Self-pay

## 2012-04-12 VITALS — BP 118/78 | HR 77 | Temp 98.0°F | Resp 18 | Ht 71.0 in | Wt 175.6 lb

## 2012-04-12 DIAGNOSIS — G8918 Other acute postprocedural pain: Principal | ICD-10-CM | POA: Insufficient documentation

## 2012-04-12 DIAGNOSIS — Z09 Encounter for follow-up examination after completed treatment for conditions other than malignant neoplasm: Secondary | ICD-10-CM

## 2012-04-12 DIAGNOSIS — R1011 Right upper quadrant pain: Secondary | ICD-10-CM | POA: Insufficient documentation

## 2012-04-12 DIAGNOSIS — Z23 Encounter for immunization: Secondary | ICD-10-CM | POA: Insufficient documentation

## 2012-04-12 LAB — COMPREHENSIVE METABOLIC PANEL
AST: 22 U/L (ref 0–37)
Alkaline Phosphatase: 66 U/L (ref 39–117)
BUN: 11 mg/dL (ref 6–23)
CO2: 28 mEq/L (ref 19–32)
Chloride: 101 mEq/L (ref 96–112)
Creatinine, Ser: 0.94 mg/dL (ref 0.50–1.35)
GFR calc non Af Amer: >90 mL/min (ref >90)
Potassium: 4.3 mEq/L (ref 3.5–5.1)
Total Bilirubin: 0.7 mg/dL (ref 0.3–1.2)

## 2012-04-12 LAB — CBC WITH DIFFERENTIAL/PLATELET
Basophils Absolute: 0 10*3/uL (ref 0.0–0.1)
HCT: 41 % (ref 39.0–52.0)
Hemoglobin: 13.8 g/dL (ref 13.0–17.0)
Lymphocytes Relative: 25 % (ref 12–46)
Monocytes Absolute: 0.4 10*3/uL (ref 0.1–1.0)
Monocytes Relative: 6 % (ref 3–12)
Neutro Abs: 4.5 10*3/uL (ref 1.7–7.7)
Neutrophils Relative %: 67 % (ref 43–77)
WBC: 6.8 10*3/uL (ref 4.0–10.5)

## 2012-04-12 LAB — URINALYSIS, ROUTINE W REFLEX MICROSCOPIC
Hgb urine dipstick: NEGATIVE
Ketones, ur: NEGATIVE mg/dL
Protein, ur: NEGATIVE mg/dL
Urobilinogen, UA: 0.2 mg/dL (ref 0.0–1.0)

## 2012-04-12 LAB — CLOSTRIDIUM DIFFICILE BY PCR: Toxigenic C. Difficile by PCR: NEGATIVE

## 2012-04-12 MED ORDER — IOHEXOL 300 MG/ML  SOLN
100.0000 mL | Freq: Once | INTRAMUSCULAR | Status: AC | PRN
  Administered 2012-04-12: 100 mL via INTRAVENOUS

## 2012-04-12 MED ORDER — POTASSIUM CHLORIDE IN NACL 20-0.9 MEQ/L-% IV SOLN
INTRAVENOUS | Status: DC
  Administered 2012-04-12 – 2012-04-13 (×2): via INTRAVENOUS
  Filled 2012-04-12 (×6): qty 1000

## 2012-04-12 MED ORDER — ACETAMINOPHEN 650 MG RE SUPP: 650.0000 mg | Freq: Four times a day (QID) | RECTAL | Status: DC | PRN

## 2012-04-12 MED ORDER — ONDANSETRON HCL 4 MG/2ML IJ SOLN: 4.0000 mg | Freq: Four times a day (QID) | INTRAMUSCULAR | Status: DC | PRN

## 2012-04-12 MED ORDER — ACETAMINOPHEN 325 MG PO TABS: 650.0000 mg | ORAL_TABLET | Freq: Four times a day (QID) | ORAL | Status: DC | PRN

## 2012-04-12 MED ORDER — PANTOPRAZOLE SODIUM 40 MG IV SOLR
40.0000 mg | Freq: Every day | INTRAVENOUS | Status: DC
  Administered 2012-04-12: 40 mg via INTRAVENOUS
  Filled 2012-04-12 (×2): qty 40

## 2012-04-12 MED ORDER — IOHEXOL 300 MG/ML  SOLN
20.0000 mL | INTRAMUSCULAR | Status: AC
  Administered 2012-04-12 (×2): 20 mL via ORAL

## 2012-04-12 MED ORDER — ENOXAPARIN SODIUM 40 MG/0.4ML ~~LOC~~ SOLN
40.0000 mg | SUBCUTANEOUS | Status: DC
  Administered 2012-04-12: 40 mg via SUBCUTANEOUS
  Filled 2012-04-12 (×2): qty 0.4

## 2012-04-12 MED ORDER — HYDROMORPHONE HCL PF 1 MG/ML IJ SOLN
1.0000 mg | INTRAMUSCULAR | Status: DC | PRN
  Administered 2012-04-12 – 2012-04-13 (×4): 1 mg via INTRAVENOUS
  Filled 2012-04-12 (×4): qty 1

## 2012-04-12 MED ORDER — INFLUENZA VIRUS VACC SPLIT PF IM SUSP
0.5000 mL | INTRAMUSCULAR | Status: AC
  Administered 2012-04-13: 0.5 mL via INTRAMUSCULAR
  Filled 2012-04-12: qty 0.5

## 2012-04-12 MED ORDER — OXYCODONE HCL 5 MG PO TABS: 5.0000 mg | ORAL_TABLET | ORAL | Status: DC | PRN

## 2012-04-12 NOTE — H&P (Signed)
Sean Aguirre is an 36 y.o. male.   Chief Complaint: abdominal pain HPI:  this is a 36 year old gentleman who underwent a laparoscopic appendectomy on September 17. He developed a postoperative pelvic abscess which underwent CT-guided drainage. He has since had the drain removed. He has been having worsening right-sided abdominal pain from the flank into the right upper quadrant. He has had nausea but no vomiting. He denies fevers. He is having loose bowel movements. He has been on antibiotics. He was originally scheduled to have a CAT scan today, but he is being admitted as he is feeling poorly  Past Medical History  Diagnosis Date  . Rectal pain   . Rectal abscess   . Asthma     as child only  . Right shoulder injury   . Intra-abdominal abscess post-procedure 03/22/2012    Past Surgical History  Procedure Date  . Lasik 2003  . Wisdom tooth extraction   . Treatment fistula anal 04/20/11  . Anal fistula plug 08/12/2011  . Laparoscopic appendectomy 03/15/2012    Procedure: APPENDECTOMY LAPAROSCOPIC;  Surgeon: Shelly Rubenstein, MD;  Location: [facility];  Service: General;  Laterality: N/A;  . Appendectomy     No family history on file. Social History:  reports that he quit smoking about 5 years ago. His smoking use included Cigars. He has quit using smokeless tobacco. His smokeless tobacco use included Chew. He reports that he drinks about 1.2 ounces of alcohol per week. He reports that he does not use illicit drugs.  Allergies:  Allergies  Allergen Reactions  . Morphine And Related Other (See Comments)    Shaking chills, Rash.  Has tolerated Vicodin and dilaudid in past per pt.  . Penicillins Other (See Comments)    Happened in childhood, was told to avoid.      (Not in a hospital admission)  No results found for this or any previous visit (from the past 48 hour(s)). No results found.  Review of Systems  All other systems reviewed and are negative.    Blood pressure 118/78,  pulse 77, temperature 98 F (36.7 C), temperature source Oral, resp. rate 18, height 5\' 11"  (1.803 m), weight 175 lb 9.6 oz (79.652 kg). Physical Exam  Constitutional: He is oriented to person, place, and time. He appears well-developed and well-nourished. No distress.  HENT:  Head: Normocephalic and atraumatic.  Eyes: Conjunctivae normal are normal. Pupils are equal, round, and reactive to light.  Neck: Normal range of motion. Neck supple. No tracheal deviation present.  Cardiovascular: Normal rate, regular rhythm, normal heart sounds and intact distal pulses.   No murmur heard. Respiratory: Effort normal and breath sounds normal. He has no wheezes. He has no rales.  GI: Soft. Bowel sounds are normal. He exhibits no distension. There is tenderness. There is no rebound.       There is mild tenderness in the right mid and upper abdomen and epigastrium. The rest of the abdomen is soft and nontender  Musculoskeletal: Normal range of motion.  Neurological: He is alert and oriented to person, place, and time.  Skin: Skin is warm and dry. No rash noted. He is not diaphoretic. No erythema.  Psychiatric: His behavior is normal. Judgment normal.     Assessment/Plan Postoperative abdominal pain of uncertain etiology  I am going to admit him to the hospital for IV rehydration, control of his nausea, and pain management. I am going to get a CAT scan of his abdomen and pelvis to rule out  an intra-abdominal abscess. I will also check a CBC, CMP, urinalysis, and stool for C. Difficile. If these studies are negative, I may need to work him up for biliary colic.  Sean Aguirre A 04/12/2012, 11:04 AM

## 2012-04-12 NOTE — Progress Notes (Signed)
Subjective:     Patient ID: Sean Aguirre, male   DOB: 21-Jan-1976, 36 y.o.   MRN: [id number]  HPI See H&P  Review of Systems     Objective:   Physical Exam     Assessment:     Postop abdominal pain    Plan:     Admit to hospital

## 2012-04-12 NOTE — Telephone Encounter (Signed)
Patient called office to report that he did not go to emergency room last night.  Patient reports that he could not afford the expense of emergency room and did not feel well enough to sit in ER waiting.  Patient given appointment @ 10:50 with Dr. Magnus Ivan and scheduled for CT today @ 3:30 pm

## 2012-04-13 ENCOUNTER — Encounter (INDEPENDENT_AMBULATORY_CARE_PROVIDER_SITE_OTHER): Payer: Managed Care, Other (non HMO) | Admitting: Surgery

## 2012-04-13 MED ORDER — PANTOPRAZOLE SODIUM 40 MG PO TBEC: 40.0000 mg | DELAYED_RELEASE_TABLET | Freq: Every day | ORAL | Status: DC

## 2012-04-13 MED ORDER — PROMETHAZINE HCL 12.5 MG PO TABS: 12.5000 mg | ORAL_TABLET | ORAL | Status: DC | PRN

## 2012-04-13 NOTE — Discharge Summary (Signed)
Physician Discharge Summary  Patient ID: Sean Aguirre MRN: [id number] DOB/AGE: [birth year] 38 y.o.  Admit date: 04/12/2012 Discharge date: 04/13/2012  Admission Diagnoses:  Discharge Diagnoses:  Active Problems:  * No active hospital problems. *  post op abdominal pain  Discharged Condition: fair  Hospital Course: uneventful overnight stay.  Labs and CT negative  Consults: None  Significant Diagnostic Studies:   Treatments: IV hydration  Discharge Exam: Blood pressure 101/60, pulse 64, temperature 97.8 F (36.6 C), temperature source Oral, resp. rate 17, height 5\' 11"  (1.803 m), weight 175 lb 9.6 oz (79.652 kg), SpO2 99.00%. General appearance: alert and no distress GI: soft, non-tender; bowel sounds normal; no masses,  no organomegaly  Disposition: 01-Home or Self Care     Medication List     As of 04/13/2012  8:23 AM    STOP taking these medications         ciprofloxacin 500 MG tablet   Commonly known as: CIPRO      TAKE these medications         acetaminophen 500 MG tablet   Commonly known as: TYLENOL   Take 1,000 mg by mouth every 6 (six) hours as needed. For pain      ibuprofen 200 MG tablet   Commonly known as: ADVIL,MOTRIN   Take 400 mg by mouth every 6 (six) hours as needed. pain      oxyCODONE-acetaminophen 5-325 MG per tablet   Commonly known as: PERCOCET/ROXICET   Take 1-2 tablets by mouth every 4 (four) hours as needed. pain      pantoprazole 40 MG tablet   Commonly known as: PROTONIX   Take 1 tablet (40 mg total) by mouth daily.      promethazine 12.5 MG tablet   Commonly known as: PHENERGAN   Take 1 tablet (12.5 mg total) by mouth every 4 (four) hours as needed for nausea.           Follow-up Information    Follow up with [facility] A, MD. Call on 04/20/2012.   Contact information:   [address] [place] [address] [phone]          Signed: Shelly Rubenstein 04/13/2012, 8:23 AM

## 2012-04-13 NOTE — Progress Notes (Signed)
  Subjective: Feeling slightly better  Objective: Vital signs in last 24 hours: Temp:  [97.4 F (36.3 C)-98.2 F (36.8 C)] 97.8 F (36.6 C) (10/16 0625) Pulse Rate:  [61-78] 64  (10/16 0625) Resp:  [17-19] 17  (10/16 0625) BP: (101-118)/(58-78) 101/60 mmHg (10/16 0625) SpO2:  [97 %-100 %] 99 % (10/16 0625) Weight:  [175 lb 9.6 oz (79.652 kg)] 175 lb 9.6 oz (79.652 kg) (10/15 1708) Last BM Date: 04/12/12  Intake/Output from previous day: 10/15 0701 - 10/16 0700 In: 1663 [P.O.:240; I.V.:1423] Out: -  Intake/Output this shift:    Abdomen soft, non distended, minimally tender in the RUQ, right flank  Lab Results:   Basename 04/12/12 1320  WBC 6.8  HGB 13.8  HCT 41.0  PLT 238   BMET  Basename 04/12/12 1320  NA 137  K 4.3  CL 101  CO2 28  GLUCOSE 138*  BUN 11  CREATININE 0.94  CALCIUM 9.6   PT/INR No results found for this basename: LABPROT:2,INR:2 in the last 72 hours ABG No results found for this basename: PHART:2,PCO2:2,PO2:2,HCO3:2 in the last 72 hours  Studies/Results: Ct Abdomen Pelvis W Contrast  04/12/2012  *RADIOLOGY REPORT*  Clinical Data: Status post appendectomy with abscess.  Drain removed 2 weeks ago.  Postop 1 month ago.  CT ABDOMEN AND PELVIS WITH CONTRAST  Technique:  Multidetector CT imaging of the abdomen and pelvis was performed following the standard protocol during bolus administration of intravenous contrast.  Contrast: OMNIPAQUE IOHEXOL 300 MG/ML  SOLN  Comparison: 03/19/2012  Findings: Clear lung bases.  Normal heart size without pericardial or pleural effusion.  Right hepatic lobe too small to characterize lesion is likely a cyst.  Normal spleen, stomach, pancreas, gallbladder, biliary tract, adrenal glands.  Interpolar left renal calculus.  No retroperitoneal or retrocrural adenopathy.  Colonic stool burden suggests constipation.  Normal terminal ileum. Appendectomy.  Mild residual retrocecal edema, including on image 48.  Decreased  since the prior exam.  No drainable abscess.  Normal small bowel without abdominal ascites.  No pelvic adenopathy.    Normal urinary bladder and prostate. Resolution of cul-de-sac fluid collection.  Minimal residual interstitial thickening within the cul-de-sac measures 1.3 cm on image 71. No drainable fluid collection.  No acute osseous abnormality.  IMPRESSION:  1.  Interval resolution of cul-de-sac fluid collection.  Minimal residual interstitial thickening, without drainable abscess. 2.  Improved retrocecal inflammatory change, status post appendectomy. 3. Possible constipation.   Original Report Authenticated By: Consuello Bossier, M.D.     Anti-infectives: Anti-infectives    None      Assessment/Plan: s/p * No surgery found * Post op abdominal pain of uncertain etiology  CT scan almost normal, labs also normal.   I am not sure what is causing his pain.  Suspect possible gastritis vs constipation vs antibiotic reaction or a combination of those. I think it is OK to discharge Recommend probiotic, nausea meds, protonix, and miralax Will follow up in office.  Still may need to work up gallbladder further  LOS: 1 day    Catha Ontko A 04/13/2012

## 2012-04-13 NOTE — Progress Notes (Signed)
Discharge instructions gone over with patient. Home medications gone over. Prescriptions given. Follow up appointment to be made. Signs and symptoms of worsening condition gone over. Patient stated understanding of all instructions.

## 2012-04-19 HISTORY — PX: PLACEMENT OF SETON: SHX6029

## 2012-04-20 ENCOUNTER — Ambulatory Visit (INDEPENDENT_AMBULATORY_CARE_PROVIDER_SITE_OTHER): Payer: Managed Care, Other (non HMO) | Admitting: Surgery

## 2012-04-20 ENCOUNTER — Encounter (INDEPENDENT_AMBULATORY_CARE_PROVIDER_SITE_OTHER): Payer: Self-pay | Admitting: Surgery

## 2012-04-20 VITALS — BP 118/82 | HR 72 | Temp 97.5°F | Resp 18 | Ht 71.0 in | Wt 177.0 lb

## 2012-04-20 DIAGNOSIS — Z09 Encounter for follow-up examination after completed treatment for conditions other than malignant neoplasm: Secondary | ICD-10-CM

## 2012-04-20 DIAGNOSIS — R1011 Right upper quadrant pain: Secondary | ICD-10-CM

## 2012-04-20 NOTE — Progress Notes (Signed)
Subjective:     Patient ID: Sean Aguirre, male   DOB: [birth year], 36 y.o.   MRN: [id number]  HPI He is here for another followup visit status post colonoscopic appendectomy complicated by postoperative abscess. Because he was still feeling poorly he was admitted to the hospital last week. His laboratory data was normal. A CAT scan of the abdomen was fairly unremarkable. Since then, he continues to feel poorly. He has right upper quadrant abdominal pain which hurts through to the back and also a deep breathing. He denies fevers. He is moving his bowels normally. He is not on antibiotics and is taking no pain medication  Review of Systems     Objective:   Physical Exam On exam, his abdomen is soft there is minimal tenderness with guarding. Some of the tenderness is in the right upper quadrant. His vital signs are normal    Assessment:     Postoperative abdominal pain of unclear etiology    Plan:     This may represent biliary dyskinesia or acalculous cholecystitis. I'm going to get a HIDA scan to evaluate his gallbladder.  I will also start him on Bentyl. I encouraged him to take an anti-inflammatory like ibuprofen. I will see him back next week

## 2012-04-28 ENCOUNTER — Encounter (HOSPITAL_COMMUNITY)
Admission: RE | Admit: 2012-04-28 | Discharge: 2012-04-28 | Disposition: A | Discharge status: Home / Self Care | Payer: Managed Care, Other (non HMO) | Source: Ambulatory Visit | Attending: Surgery | Admitting: Surgery

## 2012-04-28 ENCOUNTER — Encounter (INDEPENDENT_AMBULATORY_CARE_PROVIDER_SITE_OTHER): Payer: Self-pay | Admitting: Surgery

## 2012-04-28 ENCOUNTER — Ambulatory Visit (INDEPENDENT_AMBULATORY_CARE_PROVIDER_SITE_OTHER): Payer: Managed Care, Other (non HMO) | Admitting: Surgery

## 2012-04-28 VITALS — BP 122/64 | HR 84 | Temp 97.1°F | Resp 18 | Ht 71.0 in | Wt 176.8 lb

## 2012-04-28 DIAGNOSIS — K828 Other specified diseases of gallbladder: Secondary | ICD-10-CM | POA: Insufficient documentation

## 2012-04-28 DIAGNOSIS — Z09 Encounter for follow-up examination after completed treatment for conditions other than malignant neoplasm: Secondary | ICD-10-CM

## 2012-04-28 DIAGNOSIS — R110 Nausea: Secondary | ICD-10-CM | POA: Insufficient documentation

## 2012-04-28 DIAGNOSIS — R1011 Right upper quadrant pain: Secondary | ICD-10-CM | POA: Insufficient documentation

## 2012-04-28 MED ORDER — TECHNETIUM TC 99M MEBROFENIN IV KIT
5.0000 | PACK | Freq: Once | INTRAVENOUS | Status: AC | PRN
  Administered 2012-04-28: 5 via INTRAVENOUS

## 2012-04-28 NOTE — Progress Notes (Signed)
Subjective:     Patient ID: Sean Aguirre, male   DOB: 11-22-1975, 36 y.o.   MRN: [id number]  HPI  He is here for a followup visit. He did have some mild improvement in symptoms going to a low-fat diet but after fatty meals, his symptoms have returned. Review of Systems     Objective:   Physical Exam On exam, there is some tenderness in the epigastrium and right upper quadrant as well as right flank. The HIDA scan showed a normal ejection fraction but his symptoms were reproduced and quite severe with CCK    Assessment:     Biliary dyskinesia    Plan:     I discussed this with him in detail. He was to proceed with laparoscopic cholecystectomy which is very reasonable. Surgery will be scheduled

## 2012-04-29 ENCOUNTER — Encounter (HOSPITAL_COMMUNITY): Payer: Self-pay

## 2012-04-29 ENCOUNTER — Encounter (HOSPITAL_COMMUNITY)
Admission: RE | Admit: 2012-04-29 | Discharge: 2012-04-29 | Disposition: A | Discharge status: Home / Self Care | Payer: Managed Care, Other (non HMO) | Source: Ambulatory Visit | Attending: Surgery | Admitting: Surgery

## 2012-04-29 ENCOUNTER — Encounter (HOSPITAL_COMMUNITY): Payer: Self-pay | Admitting: Pharmacy Technician

## 2012-04-29 LAB — CBC
Hemoglobin: 13.9 g/dL (ref 13.0–17.0)
MCHC: 33.2 g/dL (ref 30.0–36.0)
Platelets: 257 10*3/uL (ref 150–400)
RDW: 14.6 % (ref 11.5–15.5)

## 2012-04-29 LAB — SURGICAL PCR SCREEN
MRSA, PCR: NEGATIVE
Staphylococcus aureus: NEGATIVE

## 2012-04-29 NOTE — Pre-Procedure Instructions (Signed)
20 Sean Aguirre  04/29/2012   Your procedure is scheduled on:  Monday, November 4th.  Report to [facility] at 5:30 AM.  Call this number if you have problems the morning of surgery: [phone]   Remember:   Do not eat food or anything to drink:After Midnight.      Take these medicines the morning of surgery with A SIP OF WATER: Pantoprazole (Protonix).  May take Acetaminophen (Tylenol) if needed.  Stop taking Ibuprofen.   Do not wear jewelry, make-up or nail polish.  Do not wear lotions, powders, or perfumes. You may wear deodorant.  Do not shave 48 hours prior to surgery. Men may shave face and neck.  Do not bring valuables to the hospital.  Contacts, dentures or bridgework may not be worn into surgery.  Leave suitcase in the car. After surgery it may be brought to your room.  For patients admitted to the hospital, checkout time is 11:00 AM the day of discharge.   Patients discharged the day of surgery will not be allowed to drive home.  Name and phone number of your driver: _______________  Special Instructions: Shower using CHG 2 nights before surgery and the night before surgery.  If you shower the day of surgery use CHG.  Use special wash - you have one bottle of CHG for all showers.  You should use approximately 1/3 of the bottle for each shower.   Please read over the following fact sheets that you were given: Pain Booklet, Coughing and Deep Breathing and Surgical Site Infection Prevention

## 2012-05-01 MED ORDER — CIPROFLOXACIN IN D5W 400 MG/200ML IV SOLN
400.0000 mg | INTRAVENOUS | Status: AC
  Administered 2012-05-02: 400 mg via INTRAVENOUS
  Filled 2012-05-01: qty 200

## 2012-05-01 NOTE — H&P (Signed)
Sean Aguirre is an 36 y.o. male.   Chief Complaint: biliary dyskinesia HPI: He has had persistent RUQ abdominal pain s/p lap appendectomy in mid September.  His course was complicated by a post op pelvic abscess s/p drainage by IR.  Has since had normal labs, neg c.diff, a CT with minimal post op changes, and finally a HIDA scan with his current symptoms reproduced with CCK administration.  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  Past Medical History  Diagnosis Date  . Rectal pain   . Rectal abscess   . Right shoulder injury   . Intra-abdominal abscess post-procedure 03/22/2012  . Asthma     as child only    Past Surgical History  Procedure Date  . Lasik 2003  . Wisdom tooth extraction   . Treatment fistula anal 04/20/11  . Anal fistula plug 08/12/2011  . Laparoscopic appendectomy 03/15/2012    Procedure: APPENDECTOMY LAPAROSCOPIC;  Surgeon: Shelly Rubenstein, MD;  Location: [facility];  Service: General;  Laterality: N/A;  . Appendectomy     No family history on file. Social History:  reports that he quit smoking about 5 years ago. His smoking use included Cigars. He quit smokeless tobacco use about 17 months ago. His smokeless tobacco use included Chew. He reports that he drinks about 1.2 ounces of alcohol per week. He reports that he does not use illicit drugs.  Allergies:  Allergies  Allergen Reactions  . Morphine And Related Other (See Comments)    Shaking chills, Rash.  Has tolerated Vicodin and dilaudid in past per pt.  . Penicillins Other (See Comments)    Happened in childhood, was told to avoid.     No prescriptions prior to admission    No results found for this or any previous visit (from the past 48 hour(s)). No results found.  Review of Systems  Constitutional: Positive for weight loss and malaise/fatigue.  Gastrointestinal: Positive for nausea and abdominal pain.  Neurological: Positive for weakness.    There were no  vitals taken for this visit. Physical Exam  Constitutional: He is oriented to person, place, and time. He appears well-developed and well-nourished. No distress.  HENT:  Head: Normocephalic and atraumatic.  Eyes: Conjunctivae normal are normal. Pupils are equal, round, and reactive to light.  Neck: Normal range of motion. Neck supple. No tracheal deviation present. No thyromegaly present.  Cardiovascular: Normal rate, regular rhythm, normal heart sounds and intact distal pulses.   No murmur heard. Respiratory: Effort normal and breath sounds normal. No respiratory distress. He has no wheezes.  GI: Soft. Bowel sounds are normal. There is tenderness.       RUQ tenderness  Musculoskeletal: Normal range of motion.  Neurological: He is alert and oriented to person, place, and time.  Skin: Skin is warm and dry.  Psychiatric: His behavior is normal. Judgment normal.     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  He is agreeable to proceed with a lap chole.  The risks including but not limited to bleeding, infection, injury to surrounding structures, CBD injury, bile leak, need to convert to an open procedure, and the chance this may not solve or improve any of his symptoms were discussed.  Likelihood of success is good.  Aysen Shieh A 05/01/2012, 7:50 PM

## 2012-05-02 ENCOUNTER — Ambulatory Visit (HOSPITAL_COMMUNITY): Payer: Managed Care, Other (non HMO) | Admitting: Certified Registered"

## 2012-05-02 ENCOUNTER — Encounter (HOSPITAL_COMMUNITY): Payer: Self-pay | Admitting: Certified Registered"

## 2012-05-02 ENCOUNTER — Ambulatory Visit (HOSPITAL_COMMUNITY)
Admission: RE | Admit: 2012-05-02 | Discharge: 2012-05-02 | Disposition: A | Discharge status: Home / Self Care | Payer: Managed Care, Other (non HMO) | Source: Ambulatory Visit | Attending: Surgery | Admitting: Surgery

## 2012-05-02 ENCOUNTER — Encounter (HOSPITAL_COMMUNITY)
Admission: RE | Disposition: A | Discharge status: Home / Self Care | Payer: Self-pay | Source: Ambulatory Visit | Attending: Surgery

## 2012-05-02 ENCOUNTER — Encounter (HOSPITAL_COMMUNITY): Payer: Self-pay | Admitting: Surgery

## 2012-05-02 DIAGNOSIS — K811 Chronic cholecystitis: Secondary | ICD-10-CM

## 2012-05-02 DIAGNOSIS — Z01812 Encounter for preprocedural laboratory examination: Secondary | ICD-10-CM | POA: Insufficient documentation

## 2012-05-02 DIAGNOSIS — K828 Other specified diseases of gallbladder: Secondary | ICD-10-CM | POA: Insufficient documentation

## 2012-05-02 HISTORY — PX: CHOLECYSTECTOMY: SHX55

## 2012-05-02 SURGERY — LAPAROSCOPIC CHOLECYSTECTOMY
Anesthesia: General | Wound class: Contaminated

## 2012-05-02 MED ORDER — OXYCODONE HCL 5 MG/5ML PO SOLN: 5.0000 mg | Freq: Once | ORAL | Status: DC | PRN

## 2012-05-02 MED ORDER — SUCCINYLCHOLINE CHLORIDE 20 MG/ML IJ SOLN
INTRAMUSCULAR | Status: DC | PRN
  Administered 2012-05-02: 100 mg via INTRAVENOUS

## 2012-05-02 MED ORDER — ACETAMINOPHEN 325 MG PO TABS: 650.0000 mg | ORAL_TABLET | ORAL | Status: DC | PRN

## 2012-05-02 MED ORDER — MIDAZOLAM HCL 5 MG/5ML IJ SOLN
INTRAMUSCULAR | Status: DC | PRN
  Administered 2012-05-02: 2 mg via INTRAVENOUS

## 2012-05-02 MED ORDER — ONDANSETRON HCL 4 MG/2ML IJ SOLN
INTRAMUSCULAR | Status: DC | PRN
  Administered 2012-05-02: 4 mg via INTRAVENOUS

## 2012-05-02 MED ORDER — PROMETHAZINE HCL 25 MG/ML IJ SOLN: 6.2500 mg | INTRAMUSCULAR | Status: DC | PRN

## 2012-05-02 MED ORDER — ACETAMINOPHEN 650 MG RE SUPP: 650.0000 mg | RECTAL | Status: DC | PRN

## 2012-05-02 MED ORDER — HYDROMORPHONE HCL PF 1 MG/ML IJ SOLN: 0.2500 mg | INTRAMUSCULAR | Status: DC | PRN

## 2012-05-02 MED ORDER — LIDOCAINE HCL (CARDIAC) 20 MG/ML IV SOLN
INTRAVENOUS | Status: DC | PRN
  Administered 2012-05-02: 100 mg via INTRAVENOUS

## 2012-05-02 MED ORDER — PROPOFOL 10 MG/ML IV BOLUS
INTRAVENOUS | Status: DC | PRN
  Administered 2012-05-02: 100 mg via INTRAVENOUS
  Administered 2012-05-02: 200 mg via INTRAVENOUS

## 2012-05-02 MED ORDER — MEPERIDINE HCL 25 MG/ML IJ SOLN: 6.2500 mg | INTRAMUSCULAR | Status: DC | PRN

## 2012-05-02 MED ORDER — SODIUM CHLORIDE 0.9 % IJ SOLN: 3.0000 mL | INTRAMUSCULAR | Status: DC | PRN

## 2012-05-02 MED ORDER — BUPIVACAINE-EPINEPHRINE 0.25% -1:200000 IJ SOLN
INTRAMUSCULAR | Status: DC | PRN
  Administered 2012-05-02: 30 mL

## 2012-05-02 MED ORDER — FENTANYL CITRATE 0.05 MG/ML IJ SOLN
INTRAMUSCULAR | Status: DC | PRN
  Administered 2012-05-02: 50 ug via INTRAVENOUS
  Administered 2012-05-02: 100 ug via INTRAVENOUS
  Administered 2012-05-02 (×2): 50 ug via INTRAVENOUS
  Administered 2012-05-02: 100 ug via INTRAVENOUS

## 2012-05-02 MED ORDER — OXYCODONE HCL 5 MG PO TABS: 5.0000 mg | ORAL_TABLET | ORAL | Status: DC | PRN

## 2012-05-02 MED ORDER — ONDANSETRON HCL 4 MG/2ML IJ SOLN: 4.0000 mg | Freq: Four times a day (QID) | INTRAMUSCULAR | Status: DC | PRN

## 2012-05-02 MED ORDER — FENTANYL CITRATE 0.05 MG/ML IJ SOLN: 25.0000 ug | INTRAMUSCULAR | Status: DC | PRN

## 2012-05-02 MED ORDER — BUPIVACAINE-EPINEPHRINE PF 0.25-1:200000 % IJ SOLN
INTRAMUSCULAR | Status: AC
  Filled 2012-05-02: qty 30

## 2012-05-02 MED ORDER — OXYCODONE HCL 5 MG PO TABS
5.0000 mg | ORAL_TABLET | Freq: Once | ORAL | Status: AC
  Administered 2012-05-02: 5 mg via ORAL

## 2012-05-02 MED ORDER — SODIUM CHLORIDE 0.9 % IV SOLN: 250.0000 mL | INTRAVENOUS | Status: DC | PRN

## 2012-05-02 MED ORDER — OXYCODONE-ACETAMINOPHEN 5-325 MG PO TABS: ORAL_TABLET | ORAL | Status: DC

## 2012-05-02 MED ORDER — SODIUM CHLORIDE 0.9 % IJ SOLN: 3.0000 mL | Freq: Two times a day (BID) | INTRAMUSCULAR | Status: DC

## 2012-05-02 MED ORDER — MIDAZOLAM HCL 2 MG/2ML IJ SOLN: 0.5000 mg | Freq: Once | INTRAMUSCULAR | Status: DC | PRN

## 2012-05-02 MED ORDER — KETOROLAC TROMETHAMINE 30 MG/ML IJ SOLN
30.0000 mg | Freq: Once | INTRAMUSCULAR | Status: AC
  Administered 2012-05-02: 30 mg via INTRAVENOUS
  Filled 2012-05-02: qty 1

## 2012-05-02 MED ORDER — SODIUM CHLORIDE 0.9 % IR SOLN
Status: DC | PRN
  Administered 2012-05-02: 1000 mL

## 2012-05-02 MED ORDER — LACTATED RINGERS IV SOLN
INTRAVENOUS | Status: DC | PRN
  Administered 2012-05-02 (×2): via INTRAVENOUS

## 2012-05-02 MED ORDER — OXYCODONE HCL 5 MG PO TABS
5.0000 mg | ORAL_TABLET | Freq: Once | ORAL | Status: DC | PRN
  Filled 2012-05-02 (×2): qty 1

## 2012-05-02 SURGICAL SUPPLY — 35 items
APPLIER CLIP 5 13 M/L LIGAMAX5 (MISCELLANEOUS) ×4
APPLIER CLIP LOGIC TI 5 (MISCELLANEOUS) ×2 IMPLANT
BANDAGE ADHESIVE 1X3 (GAUZE/BANDAGES/DRESSINGS) ×8 IMPLANT
BENZOIN TINCTURE PRP APPL 2/3 (GAUZE/BANDAGES/DRESSINGS) ×2 IMPLANT
CANISTER SUCTION 2500CC (MISCELLANEOUS) ×2 IMPLANT
CHLORAPREP W/TINT 26ML (MISCELLANEOUS) ×2 IMPLANT
CLIP APPLIE 5 13 M/L LIGAMAX5 (MISCELLANEOUS) ×2 IMPLANT
CLOTH BEACON ORANGE TIMEOUT ST (SAFETY) ×2 IMPLANT
CLSR STERI-STRIP ANTIMIC 1/2X4 (GAUZE/BANDAGES/DRESSINGS) ×2 IMPLANT
COVER MAYO STAND STRL (DRAPES) IMPLANT
COVER SURGICAL LIGHT HANDLE (MISCELLANEOUS) ×2 IMPLANT
DECANTER SPIKE VIAL GLASS SM (MISCELLANEOUS) ×2 IMPLANT
DRAPE C-ARM 42X72 X-RAY (DRAPES) IMPLANT
ELECT REM PT RETURN 9FT ADLT (ELECTROSURGICAL) ×2
ELECTRODE REM PT RTRN 9FT ADLT (ELECTROSURGICAL) ×1 IMPLANT
GLOVE SURG SIGNA 7.5 PF LTX (GLOVE) ×2 IMPLANT
GOWN PREVENTION PLUS XLARGE (GOWN DISPOSABLE) ×2 IMPLANT
GOWN STRL NON-REIN LRG LVL3 (GOWN DISPOSABLE) ×4 IMPLANT
KIT BASIN OR (CUSTOM PROCEDURE TRAY) ×2 IMPLANT
KIT ROOM TURNOVER OR (KITS) ×2 IMPLANT
NS IRRIG 1000ML POUR BTL (IV SOLUTION) ×2 IMPLANT
PAD ARMBOARD 7.5X6 YLW CONV (MISCELLANEOUS) ×4 IMPLANT
POUCH SPECIMEN RETRIEVAL 10MM (ENDOMECHANICALS) IMPLANT
SCISSORS LAP 5X35 DISP (ENDOMECHANICALS) ×2 IMPLANT
SET CHOLANGIOGRAPH 5 50 .035 (SET/KITS/TRAYS/PACK) IMPLANT
SET IRRIG TUBING LAPAROSCOPIC (IRRIGATION / IRRIGATOR) ×2 IMPLANT
SLEEVE ENDOPATH XCEL 5M (ENDOMECHANICALS) ×4 IMPLANT
SPECIMEN JAR SMALL (MISCELLANEOUS) ×2 IMPLANT
SUT MON AB 4-0 PC3 18 (SUTURE) ×2 IMPLANT
TOWEL OR 17X24 6PK STRL BLUE (TOWEL DISPOSABLE) ×2 IMPLANT
TOWEL OR 17X26 10 PK STRL BLUE (TOWEL DISPOSABLE) ×2 IMPLANT
TRAY LAPAROSCOPIC (CUSTOM PROCEDURE TRAY) ×2 IMPLANT
TROCAR XCEL BLUNT TIP 100MML (ENDOMECHANICALS) ×2 IMPLANT
TROCAR XCEL NON-BLD 5MMX100MML (ENDOMECHANICALS) ×2 IMPLANT
WATER STERILE IRR 1000ML POUR (IV SOLUTION) IMPLANT

## 2012-05-02 NOTE — Progress Notes (Signed)
CLIENT UP TO BATHROOM AND THEN C/O LOWER LEFT QUADRANT OF ABD BURNING PAIN AND DR Magnus Ivan NOTIFIED AND ORDER NOTED

## 2012-05-02 NOTE — Anesthesia Procedure Notes (Signed)
Procedure Name: Intubation Date/Time: 05/02/2012 7:34 AM Performed by: Rossie Muskrat L Pre-anesthesia Checklist: Patient identified, Timeout performed, Emergency Drugs available, Suction available and Patient being monitored Patient Re-evaluated:Patient Re-evaluated prior to inductionOxygen Delivery Method: Circle system utilized Preoxygenation: Pre-oxygenation with 100% oxygen Intubation Type: IV induction Ventilation: Mask ventilation without difficulty Laryngoscope Size: Miller and 2 Grade View: Grade I Tube size: 7.5 mm Number of attempts: 1 Airway Equipment and Method: Stylet Placement Confirmation: ETT inserted through vocal cords under direct vision,  breath sounds checked- equal and bilateral and positive ETCO2 Secured at: 21 cm Tube secured with: Tape Dental Injury: Teeth and Oropharynx as per pre-operative assessment

## 2012-05-02 NOTE — Anesthesia Preprocedure Evaluation (Signed)
Anesthesia Evaluation  Patient identified by MRN, date of birth, ID band Patient awake    Reviewed: Allergy & Precautions, H&P , NPO status , Patient's Chart, lab work & pertinent test results  History of Anesthesia Complications Negative for: history of anesthetic complications  Airway Mallampati: I TM Distance: >3 FB Neck ROM: Full    Dental No notable dental hx. (+) Teeth Intact and Dental Advisory Given   Pulmonary  breath sounds clear to auscultation  Pulmonary exam normal       Cardiovascular negative cardio ROS  Rhythm:Regular Rate:Normal     Neuro/Psych negative neurological ROS  negative psych ROS   GI/Hepatic negative GI ROS, Neg liver ROS,   Endo/Other  negative endocrine ROS  Renal/GU negative Renal ROS     Musculoskeletal   Abdominal   Peds  Hematology   Anesthesia Other Findings   Reproductive/Obstetrics                           Anesthesia Physical  Anesthesia Plan  ASA: I  Anesthesia Plan: General   Post-op Pain Management:    Induction: Intravenous  Airway Management Planned: Oral ETT  Additional Equipment:   Intra-op Plan:   Post-operative Plan: Extubation in OR  Informed Consent: I have reviewed the patients History and Physical, chart, labs and discussed the procedure including the risks, benefits and alternatives for the proposed anesthesia with the patient or authorized representative who has indicated his/her understanding and acceptance.   Dental advisory given  Plan Discussed with: CRNA and Surgeon  Anesthesia Plan Comments: (Plan routine monitors, GETA)        Anesthesia Quick Evaluation  

## 2012-05-02 NOTE — Progress Notes (Signed)
STATES ABD PAIN DECREASED FROM 6/10 TO 2/10; HAS VOIDED; NO C/O NAUSEA

## 2012-05-02 NOTE — Op Note (Signed)
Laparoscopic Cholecystectomy Procedure Note  Indications: This patient presents with symptomatic gallbladder disease and will undergo laparoscopic cholecystectomy.  Pre-operative Diagnosis: biliary dyskinesia  Post-operative Diagnosis: Same  Surgeon: Abigail Miyamoto A   Assistants: 0  Anesthesia: General endotracheal anesthesia  ASA Class: 1  Procedure Details  The patient was seen again in the Holding Room. The risks, benefits, complications, treatment options, and expected outcomes were discussed with the patient. The possibilities of reaction to medication, pulmonary aspiration, perforation of viscus, bleeding, recurrent infection, finding a normal gallbladder, the need for additional procedures, failure to diagnose a condition, the possible need to convert to an open procedure, and creating a complication requiring transfusion or operation were discussed with the patient. The likelihood of improving the patient's symptoms with return to their baseline status is good.  The patient and/or family concurred with the proposed plan, giving informed consent. The site of surgery properly noted. The patient was taken to Operating Room, identified as Marko Stai and the procedure verified as Laparoscopic Cholecystectomy with Intraoperative Cholangiogram. A Time Out was held and the above information confirmed.  Prior to the induction of general anesthesia, antibiotic prophylaxis was administered. General endotracheal anesthesia was then administered and tolerated well. After the induction, the abdomen was prepped with Chloraprep and draped in sterile fashion. The patient was positioned in the supine position.  Local anesthetic agent was injected into the skin near the umbilicus and an incision made. We dissected down to the abdominal fascia with blunt dissection.  The fascia was incised vertically and we entered the peritoneal cavity bluntly.  A pursestring suture of 0-Vicryl was placed around the  fascial opening.  The Hasson cannula was inserted and secured with the stay suture.  Pneumoperitoneum was then created with CO2 and tolerated well without any adverse changes in the patient's vital signs. An 11-mm port was placed in the subxiphoid position.  Two 5-mm ports were placed in the right upper quadrant. All skin incisions were infiltrated with a local anesthetic agent before making the incision and placing the trocars.   We positioned the patient in reverse Trendelenburg, tilted slightly to the patient's left.  The gallbladder was identified, the fundus grasped and retracted cephalad. Adhesions were lysed bluntly and with the electrocautery where indicated, taking care not to injure any adjacent organs or viscus. The infundibulum was grasped and retracted laterally, exposing the peritoneum overlying the triangle of Calot. This was then divided and exposed in a blunt fashion. The cystic duct was clearly identified and bluntly dissected circumferentially. A critical view of the cystic duct and cystic artery was obtained.  The cystic duct was then ligated with clips and divided. The cystic artery was, dissected free, ligated with clips and divided as well.   The gallbladder was dissected from the liver bed in retrograde fashion with the electrocautery. The gallbladder was removed and placed in an Endocatch sac. The liver bed was irrigated and inspected. Hemostasis was achieved with the electrocautery. Copious irrigation was utilized and was repeatedly aspirated until clear.  The gallbladder and Endocatch sac were then removed through the umbilical port site.  The pursestring suture was used to close the umbilical fascia.    We again inspected the right upper quadrant for hemostasis.  Pneumoperitoneum was released as we removed the trocars.  4-0 Monocryl was used to close the skin.   Benzoin, steri-strips, and clean dressings were applied. The patient was then extubated and brought to the recovery room  in stable condition. Instrument, sponge, and needle counts  were correct at closure and at the conclusion of the case.   Findings: Cholecystitis without Cholelithiasis  Estimated Blood Loss: Minimal         Drains: 0         Specimens: Gallbladder           Complications: None; patient tolerated the procedure well.         Disposition: PACU - hemodynamically stable.         Condition: stable

## 2012-05-02 NOTE — Interval H&P Note (Signed)
History and Physical Interval Note: no change in H and P  05/02/2012 6:42 AM  Sean Aguirre  has presented today for surgery, with the diagnosis of biliary dyskinesia  The various methods of treatment have been discussed with the patient and family. After consideration of risks, benefits and other options for treatment, the patient has consented to  Procedure(s) (LRB) with comments: LAPAROSCOPIC CHOLECYSTECTOMY (N/A) as a surgical intervention .  The patient's history has been reviewed, patient examined, no change in status, stable for surgery.  I have reviewed the patient's chart and labs.  Questions were answered to the patient's satisfaction.     Meleah Demeyer A

## 2012-05-02 NOTE — Anesthesia Postprocedure Evaluation (Signed)
  Anesthesia Post-op Note  Patient: Sean Aguirre  Procedure(s) Performed: Procedure(s) (LRB) with comments: LAPAROSCOPIC CHOLECYSTECTOMY (N/A)  Patient Location: PACU  Anesthesia Type:General  Level of Consciousness: awake, alert , oriented and patient cooperative  Airway and Oxygen Therapy: Patient Spontanous Breathing  Post-op Pain: none  Post-op Assessment: Post-op Vital signs reviewed, Patient's Cardiovascular Status Stable, Respiratory Function Stable, Patent Airway, No signs of Nausea or vomiting and Pain level controlled  Post-op Vital Signs: Reviewed and stable  Complications: No apparent anesthesia complications

## 2012-05-02 NOTE — Preoperative (Signed)
Beta Blockers   Reason not to administer Beta Blockers:Not Applicable 

## 2012-05-02 NOTE — Transfer of Care (Signed)
Immediate Anesthesia Transfer of Care Note  Patient: Sean Aguirre  Procedure(s) Performed: Procedure(s) (LRB) with comments: LAPAROSCOPIC CHOLECYSTECTOMY (N/A)  Patient Location: PACU  Anesthesia Type:General  Level of Consciousness: awake, alert  and oriented  Airway & Oxygen Therapy: Patient Spontanous Breathing and Patient connected to nasal cannula oxygen  Post-op Assessment: Report given to PACU RN, Post -op Vital signs reviewed and stable and Patient moving all extremities  Post vital signs: Reviewed and stable  Complications: No apparent anesthesia complications

## 2012-05-03 ENCOUNTER — Encounter (HOSPITAL_COMMUNITY): Payer: Self-pay | Admitting: Surgery

## 2012-05-03 ENCOUNTER — Telehealth (INDEPENDENT_AMBULATORY_CARE_PROVIDER_SITE_OTHER): Payer: Self-pay | Admitting: General Surgery

## 2012-05-03 NOTE — Telephone Encounter (Signed)
Patient's wife calling- he is status post lap chole yesterday 05/02/12. They are calling today complaining of pain in LLQ with swelling and a large knot per patient's wife. No other symptoms - nausea, vomiting, fevers. Please advise.

## 2012-05-06 ENCOUNTER — Encounter (INDEPENDENT_AMBULATORY_CARE_PROVIDER_SITE_OTHER): Payer: Self-pay | Admitting: Surgery

## 2012-05-06 ENCOUNTER — Ambulatory Visit (INDEPENDENT_AMBULATORY_CARE_PROVIDER_SITE_OTHER): Payer: Managed Care, Other (non HMO) | Admitting: Surgery

## 2012-05-06 VITALS — BP 126/72 | HR 74 | Temp 98.0°F | Resp 14 | Ht 71.0 in | Wt 175.2 lb

## 2012-05-06 DIAGNOSIS — Z09 Encounter for follow-up examination after completed treatment for conditions other than malignant neoplasm: Secondary | ICD-10-CM

## 2012-05-06 NOTE — Progress Notes (Signed)
Subjective:     Patient ID: Sean Aguirre, male   DOB: 1976-04-27, 36 y.o.   MRN: [id number]  HPI He is here for a postop visit status post laparoscopic cholecystectomy earlier this week.  He had developed swelling and pain at the umbilicus. He is moving his bowels well and tolerating a diet.  Review of Systems     Objective:   Physical Exam On exam, there is a small hematoma at the umbilicus just to the left of the incision. The rest of the incisions are well healed and there is no evidence of infection The pathology of the gallbladder showed mild chronic cholecystitis    Assessment:     Patient stable postop    Plan:     He will return to work next week part-time. I will see him back in approximately 2 weeks

## 2012-05-11 ENCOUNTER — Telehealth (INDEPENDENT_AMBULATORY_CARE_PROVIDER_SITE_OTHER): Payer: Self-pay | Admitting: General Surgery

## 2012-05-11 NOTE — Telephone Encounter (Signed)
Pt called to discuss pain control.  Dr. Magnus Ivan asked at his office visit if he needed more pain medicine, and at the time he did not.  Since then he has returned to work and is quite uncomfortable afterward.  He does not tolerate Vicodin (or any derivative of codeine) and wanted OTC alternative to try instead.  Recommended ibuprofen 400-600 mg Q6H or 800 mg Q8H.  Also can still use ice pack to site.  If this is not effective, he will call back.

## 2012-05-17 ENCOUNTER — Ambulatory Visit (INDEPENDENT_AMBULATORY_CARE_PROVIDER_SITE_OTHER): Payer: Managed Care, Other (non HMO) | Admitting: Surgery

## 2012-05-17 ENCOUNTER — Ambulatory Visit (INDEPENDENT_AMBULATORY_CARE_PROVIDER_SITE_OTHER): Payer: Managed Care, Other (non HMO) | Admitting: General Surgery

## 2012-05-17 ENCOUNTER — Encounter (INDEPENDENT_AMBULATORY_CARE_PROVIDER_SITE_OTHER): Payer: Self-pay | Admitting: General Surgery

## 2012-05-17 ENCOUNTER — Encounter (INDEPENDENT_AMBULATORY_CARE_PROVIDER_SITE_OTHER): Payer: Self-pay | Admitting: Surgery

## 2012-05-17 VITALS — BP 110/78 | HR 75 | Temp 98.5°F | Ht 71.0 in | Wt 178.8 lb

## 2012-05-17 VITALS — BP 110/78 | HR 75 | Temp 98.5°F | Ht 71.0 in | Wt 179.0 lb

## 2012-05-17 DIAGNOSIS — K603 Anal fistula, unspecified: Secondary | ICD-10-CM

## 2012-05-17 DIAGNOSIS — Z09 Encounter for follow-up examination after completed treatment for conditions other than malignant neoplasm: Secondary | ICD-10-CM

## 2012-05-17 NOTE — Patient Instructions (Signed)
Anal Abscess/Fistula  What is an anal abscess? An anal abscess is an infected cavity filled with pus found near the anus or rectum. What is an anal fistula? An anal fistula (also called fistula-in-ano) is frequently the result of a previous or current anal abscess, occurring in up to 50% of patients with abscesses. Normal anatomy includes small glands just inside the anus. Occasionally, these glands get clogged and potentially can become infected, leading to an abscess. The fistula is a tunnel that forms under the skin and connects the infected glands to the abscess. A fistula can be present with or without an abscess and may connect just to the skin of the buttocks near the anal opening. Other situations that can result in a fistula include Crohn's disease, radiation, trauma and malignancy. How does someone get an anal abscess or a fistula? The abscess is most often a result of an acute infection in the internal glands of the anus. Occasionally, bacteria, fecal material or foreign matter can clog the anal gland and create a condition for an abscess cavity to form. Other medical conditions can make these types of infections more likely.  After an abscess drains on its own or has been drained (opened), a tunnel (fistula) may persist, connecting the infected anal gland to the external skin. This typically will involve some type of drainage from the external opening and occurs in up to 50% of abscesses. If the opening on the skin heals when a fistula is present, a recurrent abscess may develop. What are the specific signs or symptoms of an abscess or fistula? A patient with an abscess may have pain, redness or swelling in the area around the anal area. Fatigue, general malaise, as well as accompanying fever or chills are also common. Similar signs and symptoms may be present when patients have a fistula, with the addition of possible irritation of the perianal skin or drainage from an external opening. Is  any specific testing necessary to diagnose an abscess or fistula? No. Most anal abscesses or fistula-in-ano are diagnosed and managed on the basis of clinical findings. Occasionally, additional studies such as ultrasound, CT scan, or MRI can assist with the diagnosis of deeper abscesses or the delineation of the fistula tunnel to help guide treatment.  What is the treatment of an anal abscess? The treatment of an abscess is surgical drainage under most circumstances. An incision is made in the skin near the anus to drain the infection. This can be done in a doctor's office with local anesthetic or in an operating room under deeper anesthesia. Hospitalization may be required for patients prone to more significant infections such as diabetics or patients with decreased immunity. Are antibiotics required to treat this type of infection? Antibiotics alone are a poor alternative to drainage of the infection. For uncomplicated abscesses, the addition of antibiotics to surgical drainage does not improve healing time or reduce the potential for recurrences. There are some conditions in which antibiotics are indicated, such as for patients with compromised or altered immunity, some cardiac valvular conditions or extensive cellulitis. A comprehensive discussion of your past medical history and a physical exam are important to determine if antibiotics are indicated. What is the treatment of an anal fistula? Surgery is almost always necessary to cure an anal fistula. Although surgery can be fairly straightforward, it may also be complicated, occasionally requiring staged or multiple operations. Consider identifying a specialist in colon and rectal surgery who would be familiar with a number of potential operations   to treat the fistula. The surgery may be performed at the same time as drainage of an abscess, although sometimes the fistula doesn't appear until weeks to years after the initial drainage. If the fistula is  straightforward, a fistulotomy may be performed. This procedure involves connecting the internal opening within the anal canal to the external opening, creating a groove that will heal from the inside out. This surgery often will require dividing a small portion of the sphincter muscle which has the unlikely potential for affecting the control of bowel movements in a limited number of cases.  Other procedures include placing material within the fistula tract to occlude it or surgically altering the surrounding tissue to accomplish closure of the fistula, with the choice of procedure depending upon the type, length, and location of the fistula. Most of the operations can be performed on an outpatient basis, but may occasionally require hospitalization. What is the recovery like from surgery? Pain after surgery is controlled with pain pills, fiber and bulk laxatives. Patients should plan for time at home using sitz baths and attempt to avoid the constipation that can be associated with prescription pain medication. Discuss with your surgeon the specific care and time away from work prior to surgery to prepare yourself for post-operative care. Can the abscess or fistula recur? If adequately treated and properly healed, both are unlikely to return. However, despite proper and indicated open or minimally invasive treatment, both abscesses and fistulas can potentially recur. Should similar symptoms arise, suggesting recurrence, it is recommended that you find a colon and rectal surgeon to manage your condition. What is a colon and rectal surgeon? Colon and rectal surgeons are experts in the surgical and non-surgical treatment of diseases of the colon, rectum and anus. They have completed advanced surgical training in the treatment of these diseases as well as full general surgical training. Board-certified colon and rectal surgeons complete residencies in general surgery and colon and rectal surgery, and pass  intensive examinations conducted by the American Board of Surgery and the American Board of Colon and Rectal Surgery. They are well-versed in the treatment of both benign and malignant diseases of the colon, rectum and anus and are able to perform routine screening examinations and surgically treat conditions if indicated to do so.  author: Mikle Bosworth, MD, FACS, FASCRS, on behalf of the ASCRS Public Relations Committee  1610 [employer] Society of Colon & Rectal Surgeons   [facility]  FULL RECTAL PREP INSTRUCTIONS (for Flexible Sigmoidoscopy / Rectal Surgery)    EVENING PRIOR TO SURGERY:   5:00pm:  Clear Liquid supper (jello-no fruit--, clear soups, tea, coffee)   No milk products.   DAY OF PROCEDURE:   2 hours before leaving house:  Take Fleet enema.  (These small enemas may be purchased at your local drug store, and may be the "drug store brand".  Do NOT purchase Mineral Oil enemas.   1 hour before leaving the house: Take another Fleet enema  --Try to retain each enema for 5-10 minutes before expelling it.  This should clean your lower colon sufficiently, and the procedure should not need to be repeated.    PATIENTS HAVING SURGERY:  Do not eat or drink anything after midnight the night before your surgery.  PATIENTS HAVING OFFICE PROCEDURE:  Do not eat solid food after midnight, but you MAY have liquids.     If you have questions, please call our office and speak to a member of the clinic staff:  [phone].

## 2012-05-17 NOTE — Progress Notes (Signed)
Subjective:     Patient ID: Sean Aguirre, male   DOB: [birth year], 36 y.o.   MRN: [id number]  HPI He is finally feeling much better from an abdominal standpoint. He is tolerating regular food. He feels the best that he has felt in 2 months. 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 There is minimal hematoma at his umbilicus    Assessment:     Patient stable postop    Plan:     He will now see Dr. 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

## 2012-05-18 NOTE — Progress Notes (Signed)
Chief Complaint  Patient presents with  . Other    perianal fistula    HISTORY: Sean Aguirre is a 36 y.o. male who presents to the office with rectal drainage.  Other symptoms include occasional swelling and pain.  he has tried a fistula plug by Dr Hoxworth in the past but the symptoms recurred several months later.  diarrhea makes the symptoms worse.  This had been occurring for a couple months.  It is intermittent in nature.  his bowel habits are normally soft but he hashad diarrhea with recent antibiotic use and his bowel movements are regular.  his fiber intake is good.     Past Medical History  Diagnosis Date  . Rectal pain   . Rectal abscess   . Right shoulder injury   . Intra-abdominal abscess post-procedure 03/22/2012  . Asthma     as child only      Past Surgical History  Procedure Date  . Lasik 2003  . Wisdom tooth extraction   . Treatment fistula anal 04/20/11  . Anal fistula plug 08/12/2011  . Laparoscopic appendectomy 03/15/2012    Procedure: APPENDECTOMY LAPAROSCOPIC;  Surgeon: Sean A Blackman, MD;  Location: [facility];  Service: General;  Laterality: N/A;  . Appendectomy   . Cholecystectomy 05/02/2012    Procedure: LAPAROSCOPIC CHOLECYSTECTOMY;  Surgeon: Sean A Blackman, MD;  Location: [facility] OR;  Service: General;  Laterality: N/A;        Current Outpatient Prescriptions  Medication Sig Dispense Refill  . acetaminophen (TYLENOL) 500 MG tablet Take 1,000 mg by mouth every 6 (six) hours as needed. For pain      . ibuprofen (ADVIL,MOTRIN) 200 MG tablet Take 400 mg by mouth every 6 (six) hours as needed. pain          Allergies  Allergen Reactions  . Morphine And Related Other (See Comments)    Shaking chills, Rash.  Has tolerated Vicodin and dilaudid in past per pt.  . Penicillins Other (See Comments)    Happened in childhood, was told to avoid.       No family history on file.  History   Social History  . Marital Status: Married    Spouse Name: N/A   Number of Children: N/A  . Years of Education: N/A   Social History Main Topics  . Smoking status: Former Smoker    Types: Cigars    Quit date: 08/05/2006  . Smokeless tobacco: Former User    Types: Chew    Quit date: 11/28/2010  . Alcohol Use: 1.2 oz/week    2 Cans of beer per week     Comment: ? 2 per wk  . Drug Use: No  . Sexually Active: No   Other Topics Concern  . None   Social History Narrative  . None      REVIEW OF SYSTEMS - PERTINENT POSITIVES ONLY: Review of Systems - Negative except symptoms stated in HPI  EXAM: Filed Vitals:   05/17/12 1140  BP: 110/78  Pulse: 75  Temp: 98.5 F (36.9 C)    General appearance: alert and cooperative Resp: clear to auscultation bilaterally Cardio: regular rate and rhythm GI: soft, non-tender; bowel sounds normal; no masses,  no organomegaly perianal: ext opening in left gluteus in left ant position    ASSESSMENT AND PLAN:  Sean Aguirre is a 36 y.o. M who has had multiple GI surgeries this year, as well as a fistula plug for an anorectal fistula.  He has had several   bouts of diarrhea due to recent antibiotic use, which I believe has contributed to his fistula opening up again.  He would like to get this repaired.  I discussed with him the surgical options, including repeat fistula plug, fistulotomy and mucus advancement flap.  I do not think he would be a good candidate for a LIFT procedure due to the description given in Dr Hoxworth's note.  I have recommended a mucosal advancement flap.  We discussed that there is a small risk of incontinence with this procedure, but it is much less than a fistulotomy.  We discussed other risks of the surgery, which are mostly bleeding and recurrence.  He understands that if there is a large amount of tissue sepsis, we will place a seton and return to the or a few weeks later for definitve management.   Sean Grieshop [name], MD Colon and Rectal Surgery / General Surgery [facility]      Visit Diagnoses: 1. Anal fistula     Primary Care Physician: Aguirre,Sean A, MD  

## 2012-06-02 ENCOUNTER — Encounter (HOSPITAL_BASED_OUTPATIENT_CLINIC_OR_DEPARTMENT_OTHER): Payer: Self-pay | Admitting: *Deleted

## 2012-06-02 NOTE — Progress Notes (Signed)
NPO AFTER MN WITH EXCEPTION WATER/ GATORADE UNTIL 0700. ARRIVES AT 1200. NEEDS HG. WILL DO FLEET ENEMA HS AND AM OF SURG .

## 2012-06-07 ENCOUNTER — Encounter (HOSPITAL_BASED_OUTPATIENT_CLINIC_OR_DEPARTMENT_OTHER): Payer: Self-pay | Admitting: Anesthesiology

## 2012-06-07 ENCOUNTER — Encounter (HOSPITAL_BASED_OUTPATIENT_CLINIC_OR_DEPARTMENT_OTHER): Payer: Self-pay | Admitting: *Deleted

## 2012-06-07 ENCOUNTER — Encounter (HOSPITAL_BASED_OUTPATIENT_CLINIC_OR_DEPARTMENT_OTHER)
Admission: RE | Disposition: A | Discharge status: Home / Self Care | Payer: Self-pay | Source: Ambulatory Visit | Attending: General Surgery

## 2012-06-07 ENCOUNTER — Ambulatory Visit (HOSPITAL_BASED_OUTPATIENT_CLINIC_OR_DEPARTMENT_OTHER): Payer: Managed Care, Other (non HMO) | Admitting: Anesthesiology

## 2012-06-07 ENCOUNTER — Ambulatory Visit (HOSPITAL_BASED_OUTPATIENT_CLINIC_OR_DEPARTMENT_OTHER)
Admission: RE | Admit: 2012-06-07 | Discharge: 2012-06-07 | Disposition: A | Discharge status: Home / Self Care | Payer: Managed Care, Other (non HMO) | Source: Ambulatory Visit | Attending: General Surgery | Admitting: General Surgery

## 2012-06-07 DIAGNOSIS — K603 Anal fistula, unspecified: Secondary | ICD-10-CM | POA: Insufficient documentation

## 2012-06-07 HISTORY — DX: Anal fistula: K60.3

## 2012-06-07 HISTORY — DX: Anal fistula, unspecified: K60.30

## 2012-06-07 HISTORY — PX: ANAL FISTULECTOMY: SHX1139

## 2012-06-07 SURGERY — FISTULECTOMY, ANAL
Anesthesia: General | Site: Anus | Wound class: Clean Contaminated

## 2012-06-07 MED ORDER — LIDOCAINE HCL (CARDIAC) 20 MG/ML IV SOLN
INTRAVENOUS | Status: DC | PRN
  Administered 2012-06-07: 100 mg via INTRAVENOUS

## 2012-06-07 MED ORDER — SUCCINYLCHOLINE CHLORIDE 20 MG/ML IJ SOLN
INTRAMUSCULAR | Status: DC | PRN
  Administered 2012-06-07: 100 mg via INTRAVENOUS

## 2012-06-07 MED ORDER — ONDANSETRON HCL 4 MG/2ML IJ SOLN
INTRAMUSCULAR | Status: DC | PRN
  Administered 2012-06-07: 4 mg via INTRAVENOUS

## 2012-06-07 MED ORDER — PROMETHAZINE HCL 25 MG/ML IJ SOLN
6.2500 mg | INTRAMUSCULAR | Status: DC | PRN
  Filled 2012-06-07: qty 1

## 2012-06-07 MED ORDER — SODIUM CHLORIDE 0.9 % IJ SOLN
3.0000 mL | Freq: Two times a day (BID) | INTRAMUSCULAR | Status: DC
  Filled 2012-06-07: qty 3

## 2012-06-07 MED ORDER — BUPIVACAINE-EPINEPHRINE 0.25% -1:200000 IJ SOLN
INTRAMUSCULAR | Status: DC | PRN
  Administered 2012-06-07: 17 mL

## 2012-06-07 MED ORDER — OXYCODONE HCL 5 MG PO TABS
5.0000 mg | ORAL_TABLET | ORAL | Status: DC | PRN
Start: 2012-06-07 — End: 2012-11-18

## 2012-06-07 MED ORDER — ACETAMINOPHEN 650 MG RE SUPP
650.0000 mg | RECTAL | Status: DC | PRN
  Filled 2012-06-07: qty 1

## 2012-06-07 MED ORDER — MIDAZOLAM HCL 5 MG/5ML IJ SOLN
INTRAMUSCULAR | Status: DC | PRN
  Administered 2012-06-07: 2 mg via INTRAVENOUS

## 2012-06-07 MED ORDER — SODIUM CHLORIDE 0.9 % IJ SOLN
3.0000 mL | INTRAMUSCULAR | Status: DC | PRN
  Filled 2012-06-07: qty 3

## 2012-06-07 MED ORDER — DOCUSATE SODIUM 100 MG PO CAPS: 100.0000 mg | ORAL_CAPSULE | Freq: Two times a day (BID) | ORAL | Status: DC

## 2012-06-07 MED ORDER — FLEET ENEMA 7-19 GM/118ML RE ENEM
1.0000 | ENEMA | Freq: Once | RECTAL | Status: DC
  Filled 2012-06-07: qty 1

## 2012-06-07 MED ORDER — LACTATED RINGERS IV SOLN
INTRAVENOUS | Status: DC
  Administered 2012-06-07: 100 mL/h via INTRAVENOUS
  Administered 2012-06-07: 14:00:00 via INTRAVENOUS
  Filled 2012-06-07: qty 1000

## 2012-06-07 MED ORDER — ONDANSETRON HCL 4 MG/2ML IJ SOLN
4.0000 mg | Freq: Four times a day (QID) | INTRAMUSCULAR | Status: DC | PRN
  Filled 2012-06-07: qty 2

## 2012-06-07 MED ORDER — FENTANYL CITRATE 0.05 MG/ML IJ SOLN
INTRAMUSCULAR | Status: DC | PRN
  Administered 2012-06-07: 25 ug via INTRAVENOUS
  Administered 2012-06-07: 50 ug via INTRAVENOUS
  Administered 2012-06-07 (×2): 25 ug via INTRAVENOUS

## 2012-06-07 MED ORDER — OXYCODONE HCL 5 MG PO TABS
5.0000 mg | ORAL_TABLET | ORAL | Status: DC | PRN
  Administered 2012-06-07: 5 mg via ORAL
  Filled 2012-06-07: qty 2

## 2012-06-07 MED ORDER — ACETAMINOPHEN 325 MG PO TABS
650.0000 mg | ORAL_TABLET | ORAL | Status: DC | PRN
  Filled 2012-06-07: qty 2

## 2012-06-07 MED ORDER — MEPERIDINE HCL 25 MG/ML IJ SOLN
6.2500 mg | INTRAMUSCULAR | Status: DC | PRN
  Filled 2012-06-07: qty 1

## 2012-06-07 MED ORDER — SODIUM CHLORIDE 0.9 % IV SOLN
250.0000 mL | INTRAVENOUS | Status: DC | PRN
  Filled 2012-06-07: qty 250

## 2012-06-07 MED ORDER — LACTATED RINGERS IV SOLN
INTRAVENOUS | Status: DC
  Administered 2012-06-07: 16:00:00 via INTRAVENOUS
  Filled 2012-06-07: qty 1000

## 2012-06-07 MED ORDER — DEXAMETHASONE SODIUM PHOSPHATE 4 MG/ML IJ SOLN
INTRAMUSCULAR | Status: DC | PRN
  Administered 2012-06-07: 8 mg via INTRAVENOUS

## 2012-06-07 MED ORDER — DIAZEPAM 5 MG PO TABS: 5.0000 mg | ORAL_TABLET | ORAL | Status: DC | PRN

## 2012-06-07 MED ORDER — DIAZEPAM 5 MG PO TABS
5.0000 mg | ORAL_TABLET | Freq: Three times a day (TID) | ORAL | Status: AC | PRN
  Administered 2012-06-07: 5 mg via ORAL
  Filled 2012-06-07: qty 1

## 2012-06-07 MED ORDER — PROPOFOL 10 MG/ML IV BOLUS
INTRAVENOUS | Status: DC | PRN
  Administered 2012-06-07: 200 mg via INTRAVENOUS

## 2012-06-07 MED ORDER — METRONIDAZOLE IN NACL 5-0.79 MG/ML-% IV SOLN
500.0000 mg | INTRAVENOUS | Status: AC
  Administered 2012-06-07: 500 mg via INTRAVENOUS
  Filled 2012-06-07: qty 100

## 2012-06-07 MED ORDER — CIPROFLOXACIN IN D5W 400 MG/200ML IV SOLN
400.0000 mg | INTRAVENOUS | Status: AC
  Administered 2012-06-07: 400 mg via INTRAVENOUS
  Filled 2012-06-07: qty 200

## 2012-06-07 MED ORDER — FENTANYL CITRATE 0.05 MG/ML IJ SOLN
25.0000 ug | INTRAMUSCULAR | Status: DC | PRN
  Administered 2012-06-07: 25 ug via INTRAVENOUS
  Filled 2012-06-07: qty 1

## 2012-06-07 MED ORDER — LIDOCAINE HCL 2 % EX GEL
CUTANEOUS | Status: DC | PRN
  Administered 2012-06-07: 1

## 2012-06-07 SURGICAL SUPPLY — 47 items
BLADE HEX COATED 2.75 (ELECTRODE) ×2 IMPLANT
BLADE SURG 15 STRL LF DISP TIS (BLADE) ×1 IMPLANT
BLADE SURG 15 STRL SS (BLADE) ×1
CANISTER SUCTION 2500CC (MISCELLANEOUS) ×2 IMPLANT
CLOTH BEACON ORANGE TIMEOUT ST (SAFETY) ×2 IMPLANT
CONT SPECI 4OZ STER CLIK (MISCELLANEOUS) ×2 IMPLANT
DECANTER SPIKE VIAL GLASS SM (MISCELLANEOUS) ×2 IMPLANT
DRAPE LG THREE QUARTER DISP (DRAPES) ×4 IMPLANT
DRAPE PED LAPAROTOMY (DRAPES) ×2 IMPLANT
DRAPE UNDERBUTTOCKS STRL (DRAPE) ×2 IMPLANT
DRSG PAD ABDOMINAL 8X10 ST (GAUZE/BANDAGES/DRESSINGS) ×2 IMPLANT
ELECT BLADE 6.5 .24CM SHAFT (ELECTRODE) ×2 IMPLANT
ELECT REM PT RETURN 9FT ADLT (ELECTROSURGICAL) ×2
ELECTRODE REM PT RTRN 9FT ADLT (ELECTROSURGICAL) ×1 IMPLANT
GAUZE SPONGE 4X4 16PLY XRAY LF (GAUZE/BANDAGES/DRESSINGS) ×2 IMPLANT
GAUZE VASELINE 3X9 (GAUZE/BANDAGES/DRESSINGS) IMPLANT
GLOVE BIO SURGEON STRL SZ 6.5 (GLOVE) ×4 IMPLANT
GLOVE BIOGEL PI IND STRL 7.0 (GLOVE) ×2 IMPLANT
GLOVE BIOGEL PI IND STRL 7.5 (GLOVE) ×1 IMPLANT
GLOVE BIOGEL PI INDICATOR 7.0 (GLOVE) ×2
GLOVE BIOGEL PI INDICATOR 7.5 (GLOVE) ×1
GOWN PREVENTION PLUS LG XLONG (DISPOSABLE) ×2 IMPLANT
GOWN PREVENTION PLUS XLARGE (GOWN DISPOSABLE) ×2 IMPLANT
GOWN STRL NON-REIN LRG LVL3 (GOWN DISPOSABLE) ×2 IMPLANT
GOWN STRL REIN XL XLG (GOWN DISPOSABLE) ×2 IMPLANT
LEGGING LITHOTOMY PAIR STRL (DRAPES) ×2 IMPLANT
NDL SAFETY ECLIPSE 18X1.5 (NEEDLE) IMPLANT
NEEDLE HYPO 18GX1.5 SHARP (NEEDLE)
NEEDLE HYPO 25X1 1.5 SAFETY (NEEDLE) ×2 IMPLANT
NS IRRIG 500ML POUR BTL (IV SOLUTION) ×2 IMPLANT
PACK BASIN DAY SURGERY FS (CUSTOM PROCEDURE TRAY) ×2 IMPLANT
PENCIL BUTTON HOLSTER BLD 10FT (ELECTRODE) ×2 IMPLANT
SPONGE GAUZE 4X4 12PLY (GAUZE/BANDAGES/DRESSINGS) ×2 IMPLANT
SPONGE SURGIFOAM ABS GEL 100 (HEMOSTASIS) ×2 IMPLANT
SPONGE SURGIFOAM ABS GEL 12-7 (HEMOSTASIS) IMPLANT
SUCTION FRAZIER TIP 10 FR DISP (SUCTIONS) ×2 IMPLANT
SUT CHROMIC 2 0 SH (SUTURE) ×6 IMPLANT
SUT CHROMIC 3 0 SH 27 (SUTURE) ×2 IMPLANT
SUT MON AB 3-0 SH 27 (SUTURE)
SUT MON AB 3-0 SH27 (SUTURE) IMPLANT
SUT VIC AB 4-0 P-3 18XBRD (SUTURE) IMPLANT
SUT VIC AB 4-0 P3 18 (SUTURE)
SYR CONTROL 10ML LL (SYRINGE) ×2 IMPLANT
SYRINGE 10CC LL (SYRINGE) ×2 IMPLANT
TOWEL OR 17X26 10 PK STRL BLUE (TOWEL DISPOSABLE) ×2 IMPLANT
TRAY DSU PREP LF (CUSTOM PROCEDURE TRAY) ×2 IMPLANT
TUBE CONNECTING 12X1/4 (SUCTIONS) ×2 IMPLANT

## 2012-06-07 NOTE — Anesthesia Postprocedure Evaluation (Signed)
  Anesthesia Post-op Note  Patient: Sean Aguirre  Procedure(s) Performed: Procedure(s) (LRB): FISTULECTOMY ANAL (N/A)  Patient Location: PACU  Anesthesia Type: General  Level of Consciousness: awake and alert   Airway and Oxygen Therapy: Patient Spontanous Breathing  Post-op Pain: mild  Post-op Assessment: Post-op Vital signs reviewed, Patient's Cardiovascular Status Stable, Respiratory Function Stable, Patent Airway and No signs of Nausea or vomiting  Last Vitals:  Filed Vitals:   06/07/12 1227  BP: 107/67  Pulse: 71  Temp: 36.6 C  Resp: 16    Post-op Vital Signs: stable   Complications: No apparent anesthesia complications

## 2012-06-07 NOTE — Op Note (Signed)
06/07/2012  2:55 PM  PATIENT:  Sean Aguirre  36 y.o. male  Patient Care Team: Shelly Rubenstein, MD as PCP - General (General Surgery)  PRE-OPERATIVE DIAGNOSIS:  perianal fistula  POST-OPERATIVE DIAGNOSIS:  peri-anal fistula  PROCEDURE:  Procedure(s): Rectal Mucosa Advancement Flap  SURGEON:  Surgeon(s): Romie Levee, MD Mariella Saa, MD  ASSISTANT: Hoxworth   ANESTHESIA:   none  EBL:  Total I/O In: 1000 [I.V.:1000] Out: -   Delay start of Pharmacological VTE agent (>24hrs) due to surgical blood loss or risk of bleeding:  not applicable  DRAINS: none   SPECIMEN:  No Specimen  DISPOSITION OF SPECIMEN:  N/A  COUNTS:  YES  PLAN OF CARE: Discharge to home after PACU  PATIENT DISPOSITION:  PACU - hemodynamically stable.  INDICATION: this is a 36 year old male who is status post fistula plug failure. The risk and benefits of a mucosal advancement flap were explained to the patient prior to the OR and consent was signed and placed on chart.  OR FINDINGS: transsphincteric anal rectal fistula, left anterior region  DESCRIPTION: the patient was identified in the preoperative holding area and taken to the OR where he was placed under general anesthesia smoothly. He was then turned prone on the operating room table in the jackknife position. His buttocks urgently take apart and he was prepped and draped in usual sterile fashion. A surgical timeout was performed indicating the correct patient, procedure, positioning and preoperative antibiotics. SCDs were noted to be in place prior to the initiation of anesthesia. After this was completed the external opening of the fistula site was identified. A Hill-Ferguson anoscope was used to do a complete anal exam. No other pathology was noted. A S-shaped fistula probe was then inserted into the anal fistula site. I could not get this to traverse the internal site and therefore I injected it with a small amount of hydrogen  peroxide. I found the internal opening and then was able to place the fistula probe internally and out through the external opening. I then opened the external opening and removed the granulation tissue around this using electrocautery. I then turned my attention to the internal opening. I decided to perform a mucosal advancement flap. The parks anal retractor was inserted and the flap was marked using Bovie electrocautery. I started at the dentate line I created a submucosal flap dissecting the muscle layer down as I went. Hemostasis was achieved with direct electrocautery and Marcaine with epinephrine to raise the flap up. I made specific efforts to make sure the base of the flap was much wider than the apex. Once I was able to bring enough mucosa up to cover the defect area and to the dentate line, I obtained hemostasis using mostly direct pressure. Once hemostasis was obtained, I sutured the lateral flap using a running 2-0 chromic suture on both sides. I then used interrupted 2-0 chromic sutures to suture the mucosa to the dentate line. The inflamed fistula site was resected from this area prior to anastomosis. There was no tension within the flap. It appeared well perfused.  I then placed a Gelfoam pack into the rectum. This completed the procedure. A sterile dressing was applied. All counts were correct per operating room staff. The patient was awakened from anesthesia and sent to the postanesthesia care unit in stable condition.

## 2012-06-07 NOTE — Interval H&P Note (Signed)
History and Physical Interval Note:  06/07/2012 1:04 PM  Sean Aguirre  has presented today for surgery, with the diagnosis of perianal fistula  The various methods of treatment have been discussed with the patient and family. After consideration of risks, benefits and other options for treatment, the patient has consented to  Procedure(s) (LRB) with comments: FISTULECTOMY ANAL (N/A) - possible mucosal advancement flap, possible seton placement as a surgical intervention .  The patient's history has been reviewed, patient examined, no change in status, stable for surgery.  I have reviewed the patient's chart and labs.  Questions were answered to the patient's satisfaction.     Vanita Panda, MD  Colorectal and General Surgery [facility]

## 2012-06-07 NOTE — Transfer of Care (Signed)
Immediate Anesthesia Transfer of Care Note  Patient: Sean Aguirre  Procedure(s) Performed: Procedure(s) (LRB): FISTULECTOMY ANAL (N/A)  Patient Location: PACU  Anesthesia Type: General  Level of Consciousness: awake, oriented, sedated and patient cooperative  Airway & Oxygen Therapy: Patient Spontanous Breathing and Patient connected to face mask oxygen  Post-op Assessment: Report given to PACU RN and Post -op Vital signs reviewed and stable  Post vital signs: Reviewed and stable  Complications: No apparent anesthesia complications

## 2012-06-07 NOTE — H&P (View-Only) (Signed)
Chief Complaint  Patient presents with  . Other    perianal fistula    HISTORY: Sean Aguirre is a 36 y.o. male who presents to the office with rectal drainage.  Other symptoms include occasional swelling and pain.  he has tried a fistula plug by Dr Johna Sheriff in the past but the symptoms recurred several months later.  diarrhea makes the symptoms worse.  This had been occurring for a couple months.  It is intermittent in nature.  his bowel habits are normally soft but he hashad diarrhea with recent antibiotic use and his bowel movements are regular.  his fiber intake is good.     Past Medical History  Diagnosis Date  . Rectal pain   . Rectal abscess   . Right shoulder injury   . Intra-abdominal abscess post-procedure 03/22/2012  . Asthma     as child only      Past Surgical History  Procedure Date  . Lasik 2003  . Wisdom tooth extraction   . Treatment fistula anal 04/20/11  . Anal fistula plug 08/12/2011  . Laparoscopic appendectomy 03/15/2012    Procedure: APPENDECTOMY LAPAROSCOPIC;  Surgeon: Shelly Rubenstein, MD;  Location: [facility];  Service: General;  Laterality: N/A;  . Appendectomy   . Cholecystectomy 05/02/2012    Procedure: LAPAROSCOPIC CHOLECYSTECTOMY;  Surgeon: Shelly Rubenstein, MD;  Location: [facility] OR;  Service: General;  Laterality: N/A;        Current Outpatient Prescriptions  Medication Sig Dispense Refill  . acetaminophen (TYLENOL) 500 MG tablet Take 1,000 mg by mouth every 6 (six) hours as needed. For pain      . ibuprofen (ADVIL,MOTRIN) 200 MG tablet Take 400 mg by mouth every 6 (six) hours as needed. pain          Allergies  Allergen Reactions  . Morphine And Related Other (See Comments)    Shaking chills, Rash.  Has tolerated Vicodin and dilaudid in past per pt.  . Penicillins Other (See Comments)    Happened in childhood, was told to avoid.       No family history on file.  History   Social History  . Marital Status: Married    Spouse Name: N/A   Number of Children: N/A  . Years of Education: N/A   Social History Main Topics  . Smoking status: Former Smoker    Types: Cigars    Quit date: 08/05/2006  . Smokeless tobacco: Former Neurosurgeon    Types: Chew    Quit date: 11/28/2010  . Alcohol Use: 1.2 oz/week    2 Cans of beer per week     Comment: ? 2 per wk  . Drug Use: No  . Sexually Active: No   Other Topics Concern  . None   Social History Narrative  . None      REVIEW OF SYSTEMS - PERTINENT POSITIVES ONLY: Review of Systems - Negative except symptoms stated in HPI  EXAM: Filed Vitals:   05/17/12 1140  BP: 110/78  Pulse: 75  Temp: 98.5 F (36.9 C)    General appearance: alert and cooperative Resp: clear to auscultation bilaterally Cardio: regular rate and rhythm GI: soft, non-tender; bowel sounds normal; no masses,  no organomegaly perianal: ext opening in left gluteus in left ant position    ASSESSMENT AND PLAN:  Sean Aguirre is a 36 y.o. M who has had multiple GI surgeries this year, as well as a fistula plug for an anorectal fistula.  He has had several  bouts of diarrhea due to recent antibiotic use, which I believe has contributed to his fistula opening up again.  He would like to get this repaired.  I discussed with him the surgical options, including repeat fistula plug, fistulotomy and mucus advancement flap.  I do not think he would be a good candidate for a LIFT procedure due to the description given in Dr Hoxworth's note.  I have recommended a mucosal advancement flap.  We discussed that there is a small risk of incontinence with this procedure, but it is much less than a fistulotomy.  We discussed other risks of the surgery, which are mostly bleeding and recurrence.  He understands that if there is a large amount of tissue sepsis, we will place a seton and return to the or a few weeks later for definitve management.   Vanita Panda, MD Colon and Rectal Surgery / General Surgery [facility]      Visit Diagnoses: 1. Anal fistula     Primary Care Physician: Shelly Rubenstein, MD

## 2012-06-07 NOTE — Anesthesia Procedure Notes (Signed)
Procedure Name: Intubation Date/Time: 06/07/2012 1:31 PM Performed by: Renella Cunas D Pre-anesthesia Checklist: Patient identified, Emergency Drugs available, Suction available and Patient being monitored Patient Re-evaluated:Patient Re-evaluated prior to inductionOxygen Delivery Method: Circle System Utilized Preoxygenation: Pre-oxygenation with 100% oxygen Intubation Type: IV induction Ventilation: Mask ventilation without difficulty Laryngoscope Size: Mac and 4 Grade View: Grade I Tube type: Oral Tube size: 8.0 mm Number of attempts: 1 Airway Equipment and Method: stylet,  oral airway,  Stylet and LTA kit utilized Placement Confirmation: ETT inserted through vocal cords under direct vision,  positive ETCO2 and breath sounds checked- equal and bilateral Secured at: 22 cm Tube secured with: Tape Dental Injury: Teeth and Oropharynx as per pre-operative assessment

## 2012-06-07 NOTE — Anesthesia Preprocedure Evaluation (Signed)
Anesthesia Evaluation  Patient identified by MRN, date of birth, ID band Patient awake    Reviewed: Allergy & Precautions, H&P , NPO status , Patient's Chart, lab work & pertinent test results  History of Anesthesia Complications Negative for: history of anesthetic complications  Airway Mallampati: I TM Distance: >3 FB Neck ROM: Full    Dental No notable dental hx. (+) Teeth Intact and Dental Advisory Given   Pulmonary  breath sounds clear to auscultation  Pulmonary exam normal       Cardiovascular negative cardio ROS  Rhythm:Regular Rate:Normal     Neuro/Psych negative neurological ROS  negative psych ROS   GI/Hepatic negative GI ROS, Neg liver ROS,   Endo/Other  negative endocrine ROS  Renal/GU negative Renal ROS     Musculoskeletal   Abdominal   Peds  Hematology   Anesthesia Other Findings   Reproductive/Obstetrics                           Anesthesia Physical  Anesthesia Plan  ASA: I  Anesthesia Plan: General   Post-op Pain Management:    Induction: Intravenous  Airway Management Planned: Oral ETT  Additional Equipment:   Intra-op Plan:   Post-operative Plan: Extubation in OR  Informed Consent: I have reviewed the patients History and Physical, chart, labs and discussed the procedure including the risks, benefits and alternatives for the proposed anesthesia with the patient or authorized representative who has indicated his/her understanding and acceptance.   Dental advisory given  Plan Discussed with: CRNA and Surgeon  Anesthesia Plan Comments: (Plan routine monitors, GETA)        Anesthesia Quick Evaluation

## 2012-06-08 ENCOUNTER — Encounter (HOSPITAL_BASED_OUTPATIENT_CLINIC_OR_DEPARTMENT_OTHER): Payer: Self-pay | Admitting: General Surgery

## 2012-06-08 ENCOUNTER — Telehealth (INDEPENDENT_AMBULATORY_CARE_PROVIDER_SITE_OTHER): Payer: Self-pay

## 2012-06-08 LAB — POCT HEMOGLOBIN-HEMACUE: Hemoglobin: 14.9 g/dL (ref 13.0–17.0)

## 2012-06-08 NOTE — Telephone Encounter (Signed)
The wife showed up to pick up a prescription.  Dr Maisie Fus spoke to the pt.  She told him Percocet is the same as Oxycodone with Acetaminophen.  She said he can add Tylenol to the Oxycodone.  She also gave him a script for Tramadol in case that doesn't work.

## 2012-06-08 NOTE — Telephone Encounter (Signed)
Sean Aguirre called c/o severe hiccups post op.  He says he has had hiccups before after taking Percocet.  I told him I would relay this message to Dr. Maisie Fus and the nurse would call him back about changing his pain medication.

## 2012-07-06 ENCOUNTER — Ambulatory Visit (INDEPENDENT_AMBULATORY_CARE_PROVIDER_SITE_OTHER): Payer: Managed Care, Other (non HMO) | Admitting: General Surgery

## 2012-07-06 VITALS — BP 114/58 | HR 60 | Temp 97.6°F | Resp 18 | Ht 71.0 in | Wt 183.0 lb

## 2012-07-06 DIAGNOSIS — K603 Anal fistula: Secondary | ICD-10-CM

## 2012-07-06 NOTE — Progress Notes (Signed)
Sean Aguirre is a 37 y.o. male who is status post a MAF on 12/10.  He is doing better.  His drainage has pretty much stopped.  He has one area that is a little sensitive but minimal pain otherwise.  Objective: Filed Vitals:   07/06/12 1500  BP: 114/58  Pulse: 60  Temp: 97.6 F (36.4 C)  Resp: 18    General appearance: alert and cooperative  Incision: healing well, internal portion feels completely healed.  Small area of open tissue at external fistula site, appears clean   Assessment: s/p  Patient Active Problem List  Diagnosis  . Anal fistula  . Intra-abdominal abscess post-procedure  . Biliary dyskinesia    Doing well  Plan: Ok to advance activity.  RTO PRN    .Vanita Panda, MD [facility], [place] [phone]   07/06/2012 3:08 PM

## 2012-07-06 NOTE — Patient Instructions (Signed)
Ok to start slowly advancing activity.  Return to the office as needed.

## 2012-11-18 ENCOUNTER — Ambulatory Visit (INDEPENDENT_AMBULATORY_CARE_PROVIDER_SITE_OTHER): Payer: Managed Care, Other (non HMO) | Admitting: General Surgery

## 2012-11-18 ENCOUNTER — Encounter (INDEPENDENT_AMBULATORY_CARE_PROVIDER_SITE_OTHER): Payer: Managed Care, Other (non HMO) | Admitting: Surgery

## 2012-11-18 ENCOUNTER — Encounter (INDEPENDENT_AMBULATORY_CARE_PROVIDER_SITE_OTHER): Payer: Self-pay | Admitting: General Surgery

## 2012-11-18 VITALS — BP 130/68 | HR 84 | Temp 98.5°F | Ht 71.0 in | Wt 183.0 lb

## 2012-11-18 DIAGNOSIS — K610 Anal abscess: Secondary | ICD-10-CM

## 2012-11-18 DIAGNOSIS — K612 Anorectal abscess: Secondary | ICD-10-CM

## 2012-11-18 MED ORDER — OXYCODONE HCL 5 MG PO TABS: 5.0000 mg | ORAL_TABLET | ORAL | Status: DC | PRN

## 2012-11-18 MED ORDER — CIPROFLOXACIN HCL 500 MG PO TABS: 500.0000 mg | ORAL_TABLET | Freq: Two times a day (BID) | ORAL | Status: AC

## 2012-11-18 NOTE — Progress Notes (Signed)
Chief complaint: Pain and swelling at anus  History: Patient comes in the office with 3 days of recurrent pain and swelling in his left perianal area. He has a history of recurrent perianal abscess and fistula, status post failed fistula plug by me and then an advancement flap was performed by Dr. Maisie Fus in December. He has felt well since that time and felt that the area was completely healed. However he has 3 days of recurrent symptoms as above. No drainage. No fever or chills.  Past Medical History  Diagnosis Date  . Rectal pain   . Right shoulder injury   . Perianal fistula    Past Surgical History  Procedure Laterality Date  . Lasik  2003  . Wisdom tooth extraction    . Treatment fistula anal  04/20/11  . Anal fistula plug  08/12/2011  . Laparoscopic appendectomy  03/15/2012    Procedure: APPENDECTOMY LAPAROSCOPIC;  Surgeon: Shelly Rubenstein, MD;  Location: [facility];  Service: General;  Laterality: N/A;  . Cholecystectomy  05/02/2012    Procedure: LAPAROSCOPIC CHOLECYSTECTOMY;  Surgeon: Shelly Rubenstein, MD;  Location: [facility];  Service: General;  Laterality: N/A;  . Appendectomy    . Anal fistulectomy  06/07/2012    Procedure: FISTULECTOMY ANAL;  Surgeon: Romie Levee, MD;  Location: [facility];  Service: General;  Laterality: N/A;   mucosal advancement flap   Current Outpatient Prescriptions  Medication Sig Dispense Refill  . acetaminophen (TYLENOL) 500 MG tablet Take 1,000 mg by mouth every 6 (six) hours as needed. For pain      . diazepam (VALIUM) 5 MG tablet Take 1 tablet (5 mg total) by mouth every 4 (four) hours as needed (urinary retention, rectal spasms).  20 tablet  0  . ibuprofen (ADVIL,MOTRIN) 200 MG tablet Take 400 mg by mouth every 6 (six) hours as needed. pain      . Multiple Vitamin (MULTIVITAMIN) tablet Take 1 tablet by mouth daily.      Marland Kitchen oxyCODONE (OXY IR/ROXICODONE) 5 MG immediate release tablet Take 1-2 tablets (5-10 mg total) by mouth every 4  (four) hours as needed for pain.  20 tablet  0  . ciprofloxacin (CIPRO) 500 MG tablet Take 1 tablet (500 mg total) by mouth 2 (two) times daily.  10 tablet  0   No current facility-administered medications for this visit.   Allergies  Allergen Reactions  . Morphine And Related Other (See Comments)    Shaking chills, Rash , IV /   Has tolerated Vicodin and dilaudid in past per pt. ,po  . Penicillins Other (See Comments)    Unknown    Exam: BP 130/68  Pulse 84  Temp(Src) 98.5 F (36.9 C) (Temporal)  Ht 5\' 11"  (1.803 m)  Wt 183 lb (83.008 kg)  BMI 25.53 kg/m2  SpO2 97% General: Well-developed healthy-appearing male Pertinent findings limited rectal exam. In the left perianal space is a 2 cm area of redness and tenderness and fluctuance at the site of his previous external fistula opening.  Assessment and plan: Recurrent perianal abscess. After explaining the procedure under local anesthesia this was unroofed and a large amount of purulent material drained. I left an iodoform wick. He is given a prescription for oxycodone and for 5 days of Cipro. He is to return to the office in 3 weeks or sooner as needed. Wound care instructions were provided.

## 2012-11-18 NOTE — Patient Instructions (Signed)
Abscess An abscess is an infected area that contains a collection of pus and debris.It can occur in almost any part of the body. An abscess is also known as a furuncle or boil. CAUSES  An abscess occurs when tissue gets infected. This can occur from blockage of oil or sweat glands, infection of hair follicles, or a minor injury to the skin. As the body tries to fight the infection, pus collects in the area and creates pressure under the skin. This pressure causes pain. People with weakened immune systems have difficulty fighting infections and get certain abscesses more often.  SYMPTOMS Usually an abscess develops on the skin and becomes a painful mass that is red, warm, and tender. If the abscess forms under the skin, you may feel a moveable soft area under the skin. Some abscesses break open (rupture) on their own, but most will continue to get worse without care. The infection can spread deeper into the body and eventually into the bloodstream, causing you to feel ill.  DIAGNOSIS  Your caregiver will take your medical history and perform a physical exam. A sample of fluid may also be taken from the abscess to determine what is causing your infection. TREATMENT  Your caregiver may prescribe antibiotic medicines to fight the infection. However, taking antibiotics alone usually does not cure an abscess. Your caregiver may need to make a small cut (incision) in the abscess to drain the pus. In some cases, gauze is packed into the abscess to reduce pain and to continue draining the area. HOME CARE INSTRUCTIONS   Only take over-the-counter or prescription medicines for pain, discomfort, or fever as directed by your caregiver.  If you were prescribed antibiotics, take them as directed. Finish them even if you start to feel better.  If gauze is used, follow your caregiver's directions for changing the gauze.  To avoid spreading the infection:  Keep your draining abscess covered with a  bandage.  Wash your hands well.  Do not share personal care items, towels, or whirlpools with others.  Avoid skin contact with others.  Keep your skin and clothes clean around the abscess.  Keep all follow-up appointments as directed by your caregiver. SEEK MEDICAL CARE IF:   You have increased pain, swelling, redness, fluid drainage, or bleeding.  You have muscle aches, chills, or a general ill feeling.  You have a fever. MAKE SURE YOU:   Understand these instructions.  Will watch your condition.  Will get help right away if you are not doing well or get worse. Document Released: 03/25/2005 Document Revised: 12/15/2011 Document Reviewed: 08/28/2011 Exit[employer] Patient Information 2014 [employer].  

## 2012-12-20 ENCOUNTER — Ambulatory Visit (INDEPENDENT_AMBULATORY_CARE_PROVIDER_SITE_OTHER): Payer: Managed Care, Other (non HMO) | Admitting: General Surgery

## 2012-12-20 ENCOUNTER — Encounter (INDEPENDENT_AMBULATORY_CARE_PROVIDER_SITE_OTHER): Payer: Self-pay | Admitting: General Surgery

## 2012-12-20 VITALS — BP 108/72 | HR 68 | Temp 99.2°F | Resp 16 | Ht 71.0 in | Wt 181.4 lb

## 2012-12-20 DIAGNOSIS — K612 Anorectal abscess: Secondary | ICD-10-CM

## 2012-12-20 DIAGNOSIS — K610 Anal abscess: Secondary | ICD-10-CM

## 2012-12-20 NOTE — Patient Instructions (Signed)
Return to the office if you develop signs of a fistula.

## 2012-12-20 NOTE — Progress Notes (Signed)
Sean Aguirre is a 37 y.o. male who is here for a follow up visit regarding a recurrent perianal abscess.  He is s/p a MAF 6 mo ago as well as a failed fistula plug.  Things were doing well until about a month ago when he developed an abscess in the same area.  This was drained in the office by Dr Johna Sheriff.  He is here today for follow up.  He reports feeling much better now and the area seems to be healed over.  Objective: Filed Vitals:   12/20/12 0931  BP: 108/72  Pulse: 68  Temp: 99.2 F (37.3 C)  Resp: 16    General appearance: alert and cooperative GI: soft, non-tender; bowel sounds normal; no masses,  no organomegaly Anal: healing L ant perianal scar, no granulation tissue, no true internal opening palpated, there is a small defect palpated in the internal sphincter at that area  Assessment and Plan: Sean Aguirre is a 37 y.o. M with a h/o recurrent anal fistula.  He has had a recurrent abscess.  He currently does not have any signs of a recurrent fistula.  I offered him the option of watching and waiting vs getting an MRI.  He would like to just watch it for now.  He will call the office if he develops any signs of recurrent abscess or fistula.      Vanita Panda, MD [facility], [place] [phone]

## 2013-01-04 ENCOUNTER — Telehealth (INDEPENDENT_AMBULATORY_CARE_PROVIDER_SITE_OTHER): Payer: Self-pay

## 2013-01-04 NOTE — Telephone Encounter (Signed)
Perianal abscess per patient Asking to see Dr. Maisie Fus asap   Refuses to see urg MD today Appt made for 01/06/13 @ 4:45 with Dr. Maisie Fus per Maralyn Sago

## 2013-01-06 ENCOUNTER — Ambulatory Visit (INDEPENDENT_AMBULATORY_CARE_PROVIDER_SITE_OTHER): Payer: Managed Care, Other (non HMO) | Admitting: General Surgery

## 2013-01-06 ENCOUNTER — Encounter (INDEPENDENT_AMBULATORY_CARE_PROVIDER_SITE_OTHER): Payer: Self-pay | Admitting: General Surgery

## 2013-01-06 VITALS — BP 110/68 | HR 64 | Temp 98.2°F | Resp 15 | Ht 71.0 in | Wt 184.0 lb

## 2013-01-06 DIAGNOSIS — K612 Anorectal abscess: Secondary | ICD-10-CM

## 2013-01-06 DIAGNOSIS — K611 Rectal abscess: Secondary | ICD-10-CM

## 2013-01-06 MED ORDER — CIPROFLOXACIN HCL 500 MG PO TABS: 500.0000 mg | ORAL_TABLET | Freq: Two times a day (BID) | ORAL | Status: DC

## 2013-01-06 MED ORDER — LIDOCAINE 5 % EX OINT: TOPICAL_OINTMENT | CUTANEOUS | Status: DC | PRN

## 2013-01-06 MED ORDER — METRONIDAZOLE 500 MG PO TABS: 500.0000 mg | ORAL_TABLET | Freq: Three times a day (TID) | ORAL | Status: DC

## 2013-01-06 NOTE — Patient Instructions (Signed)
Home Instructions Following Incision and Drainage of Perirectal Abscess  Wound care - A dressing has been applied to control any bleeding or drainage immediately after your procedure.  You may remove this dressing at your first bowel movement or tomorrow morning, whichever comes first.  There may be packing inside your wound as well that should be removed with the dressing.  You do not need to repack the area.  After the dressing is removed, clean the area gently with a mild soap and warm water and place a piece of 100% cotton over the area.  Change to cotton ever 1-3 hours while awake to keep the area clean and dry.   - Beginning tomorrow, sit in a tub of warm water for 15-20 minutes at least twice a day and after bowel movements.  This will help with healing, pain and discomfort. - A small amount of bleeding is to be expected.  If you notice an increase in the bleeding, place a large piece of cotton (about the size of a golf ball) next to the anal opening and sit on a hard surface for 15 minutes.  If the bleeding persists or if you are concerned, please call the office.  Do not sit on rubber rings.  Instead, sit on a soft pillow.    Diet -Eat a regular diet.  Avoid foods that may constipate you or give you diarrhea.  Drink 6-8 glasses of water a day and avoid seeds, nuts and popcorn until the area heals.  Medication -Take pain medication as directed.  Do not drive or operate machinery if you are taking a prescription pain medication.   - We recommend Extra Strength Tylenol for mild to moderate pain.  This can be taken as instructed on the bottle.   - If you are given a prescription for antibiotics, take as instructed by your doctor until the entire course is completed  Bowel Habits Avoid laxatives unless instructed by your doctor. Take a fiber supplement twice a day (Metamucil, FiberCon, Benefiber) Avoid excessive straining to have a bowel movement Do not go for more than 3 days without a bowel  movement.  Take a regular Fleet enema if you are constipated.  Call the office if unable to do this or no results.    Activity Resume activities as tolerated beginning tomorrow.  Avoid strenuous activities or sports for one week.    Call the office if you have any questions.  Call IMMEDIATELY if you should develop persistent heavy rectal bleeding, increase in pain, difficulty urinating or fever greater than 100 F.   

## 2013-01-06 NOTE — Progress Notes (Signed)
Sean Aguirre is a 37 y.o. male who is here for a follow up visit regarding his perianal abscess.  He developed recurrent anal pain this Mon.  This progressed over the last few days and he developed drainage yesterday.  He denies any fevers but is having some left sided pain.   Past Medical History  Diagnosis Date  . Rectal pain   . Right shoulder injury   . Perianal fistula    Past Surgical History  Procedure Laterality Date  . Lasik  2003  . Wisdom tooth extraction    . Treatment fistula anal  04/20/11  . Anal fistula plug  08/12/2011  . Laparoscopic appendectomy  03/15/2012    Procedure: APPENDECTOMY LAPAROSCOPIC;  Surgeon: Douglas A Blackman, MD;  Location: [facility];  Service: General;  Laterality: N/A;  . Cholecystectomy  05/02/2012    Procedure: LAPAROSCOPIC CHOLECYSTECTOMY;  Surgeon: Douglas A Blackman, MD;  Location: [facility];  Service: General;  Laterality: N/A;  . Appendectomy    . Anal fistulectomy  06/07/2012    Procedure: FISTULECTOMY ANAL;  Surgeon: Merl Guardino, MD;  Location: [facility];  Service: General;  Laterality: N/A;   mucosal advancement flap   Current outpatient prescriptions:acetaminophen (TYLENOL) 500 MG tablet, Take 1,000 mg by mouth every 6 (six) hours as needed. For pain, Disp: , Rfl: ;  ibuprofen (ADVIL,MOTRIN) 200 MG tablet, Take 400 mg by mouth every 6 (six) hours as needed. pain, Disp: , Rfl: ;  Multiple Vitamin (MULTIVITAMIN) tablet, Take 1 tablet by mouth daily., Disp: , Rfl:  oxyCODONE-acetaminophen (PERCOCET/ROXICET) 5-325 MG per tablet, Take 1 tablet by mouth every 4 (four) hours as needed for pain., Disp: , Rfl: ;  ciprofloxacin (CIPRO) 500 MG tablet, Take 1 tablet (500 mg total) by mouth 2 (two) times daily., Disp: 14 tablet, Rfl: 0;  lidocaine (XYLOCAINE) 5 % ointment, Apply topically as needed., Disp: 35.44 g, Rfl: 0 metroNIDAZOLE (FLAGYL) 500 MG tablet, Take 1 tablet (500 mg total) by mouth 3 (three) times daily., Disp: 21 tablet, Rfl:  0 Allergies  Allergen Reactions  . Morphine And Related Other (See Comments)    Shaking chills, Rash , IV /   Has tolerated Vicodin and dilaudid in past per pt. ,po  . Penicillins Other (See Comments)    Unknown    No family history on file.  History   Social History  . Marital Status: Married    Spouse Name: N/A    Number of Children: N/A  . Years of Education: N/A   Social History Main Topics  . Smoking status: Former Smoker    Types: Cigars    Quit date: 08/05/2006  . Smokeless tobacco: Former User    Types: Chew    Quit date: 11/28/2010     Comment: used tobacco for 23 yrs ago  -- quit chew 2012  . Alcohol Use: 1.2 oz/week    2 Cans of beer per week  . Drug Use: No  . Sexually Active: None   Other Topics Concern  . None   Social History Narrative  . None    Review of Systems - General ROS: negative for - chills or fever Respiratory ROS: no cough, shortness of breath, or wheezing Cardiovascular ROS: no chest pain or dyspnea on exertion Gastrointestinal ROS: no abdominal pain, change in bowel habits, or black or bloody stools   Objective: Filed Vitals:   01/06/13 1701  BP: 110/68  Pulse: 64  Temp: 98.2 F (36.8 C)  Resp:   15    General appearance: alert and cooperative Resp: clear to auscultation bilaterally Cardio: regular rate and rhythm GI: soft, non-tender; bowel sounds normal; no masses,  no organomegaly Anal: L anterior lesion, injected area with lidocaine SQ.  Abscess cavity probed and no purulence expressed, 3-4 cm induration noted around opening  Assessment and Plan: Sean Aguirre is a 37 y.o. M with a recurrent abscess and most likely recurrent fistula.  I have placed him on a week of Cip/Flag.  I will perform an anal EUA and possible seton placement next week, once the inflammation has resolved.  We discussed the risks and benefits for this surgical procedure, which are mainly pain and bleeding.  He has agreed to proceed.      .Jacqueli Pangallo C  Agustus Mane, MD [facility] [place] [phone]        

## 2013-01-09 ENCOUNTER — Encounter (HOSPITAL_BASED_OUTPATIENT_CLINIC_OR_DEPARTMENT_OTHER): Payer: Self-pay | Admitting: *Deleted

## 2013-01-10 ENCOUNTER — Encounter (HOSPITAL_BASED_OUTPATIENT_CLINIC_OR_DEPARTMENT_OTHER): Payer: Self-pay | Admitting: *Deleted

## 2013-01-10 NOTE — Progress Notes (Signed)
NPO AFTER MN WITH EXCEPTION CLEAR LIQUIDS UNTIL 0900 (NO CREAM/ MILK PRODUCTS). ARRIVES AT 1300. NEEDS HG.

## 2013-01-11 IMAGING — NM NM HEPATO W/GB/PHARM/[PERSON_NAME]
2 series · 12 of 12 positions shown · non-contrast
Comparison: CT 04/12/2012

CLINICAL DATA: Right upper quadrant pain

NUCLEAR MEDICINE HEPATOBILIARY IMAGING WITH GALLBLADDER EF
TECHNIQUE: Sequential images of the abdomen were obtained [DATE]
minutes following intravenous administration of
radiopharmaceutical.  After slow intravenous infusion of 3.4ucg
Cholecystokinin, gallbladder ejection fraction was determined.
Radiopharmaceutical:  G.EmNi Sc-RRm Choletec

[he hepatobiliary · 3.43mm/px · 6 of 30 frames shown (1 of 2)]
[frame 3/30]
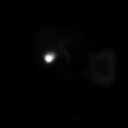
[frame 8/30]
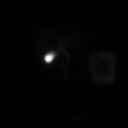
[frame 13/30]
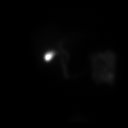
[frame 18/30]
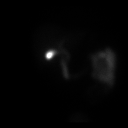
[frame 23/30]
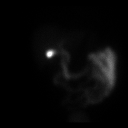
[frame 28/30]
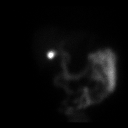

[he hepatobiliary · 3.43mm/px · 6 of 46 frames shown (2 of 2)]
[frame 4/46]
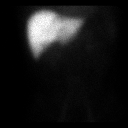
[frame 12/46]
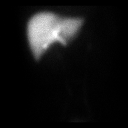
[frame 20/46]
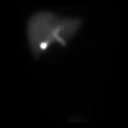
[frame 27/46]
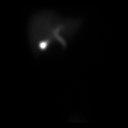
[frame 35/46]
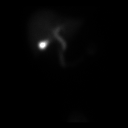
[frame 43/46]
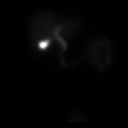

[12 of 12 positions shown; findings below may reference images not displayed]

FINDINGS: There is prompt uptake and excretion of radiotracer by
the liver.  No evidence of cystic duct or common duct obstruction.
Gallbladder ejection fraction is 76%.  Normal is greater than 30%.

The patient did experience symptoms during CCK infusion.
IMPRESSION: Normal gallbladder ejection fraction.  The patient experienced
nausea and right upper quadrant pain with CCK infusion.

## 2013-01-13 ENCOUNTER — Encounter (HOSPITAL_BASED_OUTPATIENT_CLINIC_OR_DEPARTMENT_OTHER): Payer: Self-pay | Admitting: *Deleted

## 2013-01-13 ENCOUNTER — Ambulatory Visit (HOSPITAL_BASED_OUTPATIENT_CLINIC_OR_DEPARTMENT_OTHER)
Admission: RE | Admit: 2013-01-13 | Discharge: 2013-01-13 | Disposition: A | Discharge status: Home / Self Care | Payer: Managed Care, Other (non HMO) | Source: Ambulatory Visit | Attending: General Surgery | Admitting: General Surgery

## 2013-01-13 ENCOUNTER — Ambulatory Visit (HOSPITAL_BASED_OUTPATIENT_CLINIC_OR_DEPARTMENT_OTHER): Payer: Managed Care, Other (non HMO) | Admitting: Anesthesiology

## 2013-01-13 ENCOUNTER — Encounter (HOSPITAL_BASED_OUTPATIENT_CLINIC_OR_DEPARTMENT_OTHER)
Admission: RE | Disposition: A | Discharge status: Home / Self Care | Payer: Self-pay | Source: Ambulatory Visit | Attending: General Surgery

## 2013-01-13 ENCOUNTER — Encounter (HOSPITAL_BASED_OUTPATIENT_CLINIC_OR_DEPARTMENT_OTHER): Payer: Self-pay | Admitting: Anesthesiology

## 2013-01-13 DIAGNOSIS — K603 Anal fistula, unspecified: Secondary | ICD-10-CM | POA: Insufficient documentation

## 2013-01-13 HISTORY — PX: PLACEMENT OF SETON: SHX6029

## 2013-01-13 HISTORY — PX: EVALUATION UNDER ANESTHESIA WITH ANAL FISTULECTOMY: SHX5621

## 2013-01-13 HISTORY — DX: Diarrhea, unspecified: R19.7

## 2013-01-13 SURGERY — EXAM UNDER ANESTHESIA WITH ANAL FISTULECTOMY
Anesthesia: General | Site: Rectum | Wound class: Contaminated

## 2013-01-13 MED ORDER — BUPIVACAINE-EPINEPHRINE 0.25% -1:200000 IJ SOLN
INTRAMUSCULAR | Status: DC | PRN
  Administered 2013-01-13: 20 mL

## 2013-01-13 MED ORDER — ACETAMINOPHEN 325 MG PO TABS
650.0000 mg | ORAL_TABLET | ORAL | Status: DC | PRN
  Filled 2013-01-13: qty 2

## 2013-01-13 MED ORDER — DOCUSATE SODIUM 100 MG PO CAPS: 100.0000 mg | ORAL_CAPSULE | Freq: Two times a day (BID) | ORAL | Status: DC

## 2013-01-13 MED ORDER — OXYCODONE-ACETAMINOPHEN 5-325 MG PO TABS: 1.0000 | ORAL_TABLET | ORAL | Status: DC | PRN

## 2013-01-13 MED ORDER — LACTATED RINGERS IV SOLN
INTRAVENOUS | Status: DC
  Administered 2013-01-13: 15:00:00 via INTRAVENOUS
  Filled 2013-01-13: qty 1000

## 2013-01-13 MED ORDER — SODIUM CHLORIDE 0.9 % IJ SOLN
3.0000 mL | Freq: Two times a day (BID) | INTRAMUSCULAR | Status: DC
  Filled 2013-01-13: qty 3

## 2013-01-13 MED ORDER — PROMETHAZINE HCL 25 MG/ML IJ SOLN
6.2500 mg | INTRAMUSCULAR | Status: DC | PRN
  Filled 2013-01-13: qty 1

## 2013-01-13 MED ORDER — MIDAZOLAM HCL 5 MG/5ML IJ SOLN
INTRAMUSCULAR | Status: DC | PRN
  Administered 2013-01-13 (×2): 2 mg via INTRAVENOUS
  Administered 2013-01-13 (×2): 1 mg via INTRAVENOUS

## 2013-01-13 MED ORDER — PROPOFOL 10 MG/ML IV EMUL
INTRAVENOUS | Status: DC | PRN
  Administered 2013-01-13: 100 ug/kg/min via INTRAVENOUS

## 2013-01-13 MED ORDER — LIDOCAINE 5 % EX OINT
TOPICAL_OINTMENT | CUTANEOUS | Status: DC | PRN
  Administered 2013-01-13: 1

## 2013-01-13 MED ORDER — HYDROMORPHONE HCL PF 1 MG/ML IJ SOLN
0.2500 mg | INTRAMUSCULAR | Status: DC | PRN
  Filled 2013-01-13: qty 1

## 2013-01-13 MED ORDER — DEXAMETHASONE SODIUM PHOSPHATE 4 MG/ML IJ SOLN
INTRAMUSCULAR | Status: DC | PRN
  Administered 2013-01-13: 8 mg via INTRAVENOUS

## 2013-01-13 MED ORDER — ACETAMINOPHEN 650 MG RE SUPP
650.0000 mg | RECTAL | Status: DC | PRN
  Filled 2013-01-13: qty 1

## 2013-01-13 MED ORDER — MEPERIDINE HCL 25 MG/ML IJ SOLN
6.2500 mg | INTRAMUSCULAR | Status: DC | PRN
  Filled 2013-01-13: qty 1

## 2013-01-13 MED ORDER — ONDANSETRON HCL 4 MG/2ML IJ SOLN
INTRAMUSCULAR | Status: DC | PRN
  Administered 2013-01-13: 4 mg via INTRAVENOUS

## 2013-01-13 MED ORDER — SODIUM CHLORIDE 0.9 % IV SOLN
250.0000 mL | INTRAVENOUS | Status: DC | PRN
  Filled 2013-01-13: qty 250

## 2013-01-13 MED ORDER — LIDOCAINE HCL (CARDIAC) 20 MG/ML IV SOLN
INTRAVENOUS | Status: DC | PRN
  Administered 2013-01-13: 50 mg via INTRAVENOUS

## 2013-01-13 MED ORDER — FENTANYL CITRATE 0.05 MG/ML IJ SOLN
INTRAMUSCULAR | Status: DC | PRN
  Administered 2013-01-13 (×2): 50 ug via INTRAVENOUS

## 2013-01-13 MED ORDER — OXYCODONE HCL 5 MG/5ML PO SOLN
5.0000 mg | Freq: Once | ORAL | Status: DC | PRN
  Filled 2013-01-13: qty 5

## 2013-01-13 MED ORDER — ACETAMINOPHEN 10 MG/ML IV SOLN
1000.0000 mg | Freq: Once | INTRAVENOUS | Status: DC | PRN
  Filled 2013-01-13: qty 100

## 2013-01-13 MED ORDER — ONDANSETRON HCL 4 MG/2ML IJ SOLN
4.0000 mg | Freq: Four times a day (QID) | INTRAMUSCULAR | Status: DC | PRN
  Filled 2013-01-13: qty 2

## 2013-01-13 MED ORDER — PROPOFOL 10 MG/ML IV BOLUS
INTRAVENOUS | Status: DC | PRN
  Administered 2013-01-13 (×2): 50 mg via INTRAVENOUS

## 2013-01-13 MED ORDER — OXYCODONE HCL 5 MG PO TABS
5.0000 mg | ORAL_TABLET | Freq: Once | ORAL | Status: DC | PRN
  Filled 2013-01-13: qty 1

## 2013-01-13 MED ORDER — 0.9 % SODIUM CHLORIDE (POUR BTL) OPTIME
TOPICAL | Status: DC | PRN
  Administered 2013-01-13: 1000 mL

## 2013-01-13 MED ORDER — SODIUM CHLORIDE 0.9 % IJ SOLN
3.0000 mL | INTRAMUSCULAR | Status: DC | PRN
  Filled 2013-01-13: qty 3

## 2013-01-13 SURGICAL SUPPLY — 50 items
BENZOIN TINCTURE PRP APPL 2/3 (GAUZE/BANDAGES/DRESSINGS) ×2 IMPLANT
BLADE HEX COATED 2.75 (ELECTRODE) ×2 IMPLANT
BLADE SURG 10 STRL SS (BLADE) IMPLANT
BLADE SURG 15 STRL LF DISP TIS (BLADE) ×1 IMPLANT
BLADE SURG 15 STRL SS (BLADE) ×1
BRIEF STRETCH FOR OB PAD LRG (UNDERPADS AND DIAPERS) ×2 IMPLANT
CANISTER SUCTION 2500CC (MISCELLANEOUS) ×2 IMPLANT
CLOTH BEACON ORANGE TIMEOUT ST (SAFETY) ×2 IMPLANT
COVER MAYO STAND STRL (DRAPES) ×2 IMPLANT
COVER TABLE BACK 60X90 (DRAPES) ×2 IMPLANT
DECANTER SPIKE VIAL GLASS SM (MISCELLANEOUS) IMPLANT
DRAPE LG THREE QUARTER DISP (DRAPES) ×4 IMPLANT
DRAPE PED LAPAROTOMY (DRAPES) ×2 IMPLANT
DRAPE UNDERBUTTOCKS STRL (DRAPE) IMPLANT
DRSG PAD ABDOMINAL 8X10 ST (GAUZE/BANDAGES/DRESSINGS) ×2 IMPLANT
ELECT BLADE 6.5 .24CM SHAFT (ELECTRODE) IMPLANT
ELECT REM PT RETURN 9FT ADLT (ELECTROSURGICAL) ×2
ELECTRODE REM PT RTRN 9FT ADLT (ELECTROSURGICAL) ×1 IMPLANT
GAUZE SPONGE 4X4 12PLY STRL LF (GAUZE/BANDAGES/DRESSINGS) ×2 IMPLANT
GAUZE SPONGE 4X4 16PLY XRAY LF (GAUZE/BANDAGES/DRESSINGS) ×2 IMPLANT
GAUZE VASELINE 3X9 (GAUZE/BANDAGES/DRESSINGS) ×2 IMPLANT
GLOVE BIO SURGEON STRL SZ 6.5 (GLOVE) ×4 IMPLANT
GLOVE INDICATOR 7.0 STRL GRN (GLOVE) ×4 IMPLANT
GOWN PREVENTION PLUS LG XLONG (DISPOSABLE) IMPLANT
GOWN PREVENTION PLUS XLARGE (GOWN DISPOSABLE) ×2 IMPLANT
GOWN PREVENTION PLUS XXLARGE (GOWN DISPOSABLE) ×2 IMPLANT
GOWN STRL NON-REIN LRG LVL3 (GOWN DISPOSABLE) ×2 IMPLANT
GOWN STRL REIN XL XLG (GOWN DISPOSABLE) IMPLANT
LEGGING LITHOTOMY PAIR STRL (DRAPES) IMPLANT
LOOP VESSEL MAXI BLUE (MISCELLANEOUS) ×2 IMPLANT
NDL SAFETY ECLIPSE 18X1.5 (NEEDLE) IMPLANT
NEEDLE HYPO 18GX1.5 SHARP (NEEDLE)
NEEDLE HYPO 25X1 1.5 SAFETY (NEEDLE) ×2 IMPLANT
NS IRRIG 500ML POUR BTL (IV SOLUTION) ×2 IMPLANT
PACK BASIN DAY SURGERY FS (CUSTOM PROCEDURE TRAY) ×2 IMPLANT
PENCIL BUTTON HOLSTER BLD 10FT (ELECTRODE) ×2 IMPLANT
SPONGE GAUZE 4X4 12PLY (GAUZE/BANDAGES/DRESSINGS) ×2 IMPLANT
SPONGE SURGIFOAM ABS GEL 12-7 (HEMOSTASIS) IMPLANT
SUT CHROMIC 2 0 SH (SUTURE) IMPLANT
SUT CHROMIC 3 0 SH 27 (SUTURE) IMPLANT
SUT ETHIBOND 0 (SUTURE) ×2 IMPLANT
SUT MON AB 3-0 SH 27 (SUTURE)
SUT MON AB 3-0 SH27 (SUTURE) IMPLANT
SUT VIC AB 4-0 P-3 18XBRD (SUTURE) IMPLANT
SUT VIC AB 4-0 P3 18 (SUTURE)
SYR CONTROL 10ML LL (SYRINGE) ×2 IMPLANT
TOWEL OR 17X24 6PK STRL BLUE (TOWEL DISPOSABLE) ×4 IMPLANT
TRAY DSU PREP LF (CUSTOM PROCEDURE TRAY) ×2 IMPLANT
TUBE CONNECTING 12X1/4 (SUCTIONS) ×2 IMPLANT
YANKAUER SUCT BULB TIP NO VENT (SUCTIONS) ×2 IMPLANT

## 2013-01-13 NOTE — H&P (View-Only) (Signed)
Sean Aguirre is a 37 y.o. male who is here for a follow up visit regarding his perianal abscess.  He developed recurrent anal pain this Mon.  This progressed over the last few days and he developed drainage yesterday.  He denies any fevers but is having some left sided pain.   Past Medical History  Diagnosis Date  . Rectal pain   . Right shoulder injury   . Perianal fistula    Past Surgical History  Procedure Laterality Date  . Lasik  2003  . Wisdom tooth extraction    . Treatment fistula anal  04/20/11  . Anal fistula plug  08/12/2011  . Laparoscopic appendectomy  03/15/2012    Procedure: APPENDECTOMY LAPAROSCOPIC;  Surgeon: Shelly Rubenstein, MD;  Location: [facility];  Service: General;  Laterality: N/A;  . Cholecystectomy  05/02/2012    Procedure: LAPAROSCOPIC CHOLECYSTECTOMY;  Surgeon: Shelly Rubenstein, MD;  Location: [facility];  Service: General;  Laterality: N/A;  . Appendectomy    . Anal fistulectomy  06/07/2012    Procedure: FISTULECTOMY ANAL;  Surgeon: Romie Levee, MD;  Location: [facility];  Service: General;  Laterality: N/A;   mucosal advancement flap   Current outpatient prescriptions:acetaminophen (TYLENOL) 500 MG tablet, Take 1,000 mg by mouth every 6 (six) hours as needed. For pain, Disp: , Rfl: ;  ibuprofen (ADVIL,MOTRIN) 200 MG tablet, Take 400 mg by mouth every 6 (six) hours as needed. pain, Disp: , Rfl: ;  Multiple Vitamin (MULTIVITAMIN) tablet, Take 1 tablet by mouth daily., Disp: , Rfl:  oxyCODONE-acetaminophen (PERCOCET/ROXICET) 5-325 MG per tablet, Take 1 tablet by mouth every 4 (four) hours as needed for pain., Disp: , Rfl: ;  ciprofloxacin (CIPRO) 500 MG tablet, Take 1 tablet (500 mg total) by mouth 2 (two) times daily., Disp: 14 tablet, Rfl: 0;  lidocaine (XYLOCAINE) 5 % ointment, Apply topically as needed., Disp: 35.44 g, Rfl: 0 metroNIDAZOLE (FLAGYL) 500 MG tablet, Take 1 tablet (500 mg total) by mouth 3 (three) times daily., Disp: 21 tablet, Rfl:  0 Allergies  Allergen Reactions  . Morphine And Related Other (See Comments)    Shaking chills, Rash , IV /   Has tolerated Vicodin and dilaudid in past per pt. ,po  . Penicillins Other (See Comments)    Unknown    No family history on file.  History   Social History  . Marital Status: Married    Spouse Name: N/A    Number of Children: N/A  . Years of Education: N/A   Social History Main Topics  . Smoking status: Former Smoker    Types: Cigars    Quit date: 08/05/2006  . Smokeless tobacco: Former Neurosurgeon    Types: Chew    Quit date: 11/28/2010     Comment: used tobacco for 23 yrs ago  -- quit chew 2012  . Alcohol Use: 1.2 oz/week    2 Cans of beer per week  . Drug Use: No  . Sexually Active: None   Other Topics Concern  . None   Social History Narrative  . None    Review of Systems - General ROS: negative for - chills or fever Respiratory ROS: no cough, shortness of breath, or wheezing Cardiovascular ROS: no chest pain or dyspnea on exertion Gastrointestinal ROS: no abdominal pain, change in bowel habits, or black or bloody stools   Objective: Filed Vitals:   01/06/13 1701  BP: 110/68  Pulse: 64  Temp: 98.2 F (36.8 C)  Resp:  15    General appearance: alert and cooperative Resp: clear to auscultation bilaterally Cardio: regular rate and rhythm GI: soft, non-tender; bowel sounds normal; no masses,  no organomegaly Anal: L anterior lesion, injected area with lidocaine SQ.  Abscess cavity probed and no purulence expressed, 3-4 cm induration noted around opening  Assessment and Plan: Sean Aguirre is a 37 y.o. M with a recurrent abscess and most likely recurrent fistula.  I have placed him on a week of Cip/Flag.  I will perform an anal EUA and possible seton placement next week, once the inflammation has resolved.  We discussed the risks and benefits for this surgical procedure, which are mainly pain and bleeding.  He has agreed to proceed.      Vanita Panda, MD [facility], [place] [phone]

## 2013-01-13 NOTE — Interval H&P Note (Signed)
History and Physical Interval Note:  01/13/2013 3:55 PM  Sean Aguirre  has presented today for surgery, with the diagnosis of anal fistula   The various methods of treatment have been discussed with the patient and family. After consideration of risks, benefits and other options for treatment, the patient has consented to  Procedure(s): EXAM UNDER ANESTHESIA WITH possible  ANAL FISTULoTOMY  (N/A) PLACEMENT OF SETON (N/A) as a surgical intervention .  The patient's history has been reviewed, patient examined, no change in status, stable for surgery.  I have reviewed the patient's chart and labs.  Questions were answered to the patient's satisfaction.     Vanita Panda, MD  Colorectal and General Surgery [facility]

## 2013-01-13 NOTE — Transfer of Care (Addendum)
Immediate Anesthesia Transfer of Care Note  Patient: Sean Aguirre  Procedure(s) Performed: Procedure(s) (LRB): EXAM UNDER ANESTHESIA WITH possible  ANAL FISTULoTOMY  (N/A) PLACEMENT OF SETON (N/A)  Patient Location:[facility]  Anesthesia Type: General  Level of Consciousness: awake, alert  and oriented  Airway & Oxygen Therapy: Patient Spontanous Breathing and Patient connected to face mask oxygen  Post-op Assessment: Report given to PACU RN and Post -op Vital signs reviewed and stable  Post vital signs: Reviewed and stable  Complications: No apparent anesthesia complications

## 2013-01-13 NOTE — Anesthesia Preprocedure Evaluation (Addendum)
Anesthesia Evaluation  Patient identified by MRN, date of birth, ID band Patient awake    Reviewed: Allergy & Precautions, H&P , NPO status , Patient's Chart, lab work & pertinent test results  History of Anesthesia Complications Negative for: history of anesthetic complications  Airway Mallampati: I TM Distance: >3 FB Neck ROM: Full    Dental no notable dental hx. (+) Teeth Intact and Dental Advisory Given   Pulmonary  breath sounds clear to auscultation  Pulmonary exam normal       Cardiovascular negative cardio ROS  Rhythm:Regular Rate:Normal     Neuro/Psych negative neurological ROS  negative psych ROS   GI/Hepatic negative GI ROS, Neg liver ROS,   Endo/Other  negative endocrine ROS  Renal/GU negative Renal ROS     Musculoskeletal   Abdominal   Peds  Hematology   Anesthesia Other Findings   Reproductive/Obstetrics                           Anesthesia Physical  Anesthesia Plan  ASA: I  Anesthesia Plan: General and MAC   Post-op Pain Management:    Induction: Intravenous  Airway Management Planned:   Additional Equipment:   Intra-op Plan:   Post-operative Plan: Extubation in OR  Informed Consent: I have reviewed the patients History and Physical, chart, labs and discussed the procedure including the risks, benefits and alternatives for the proposed anesthesia with the patient or authorized representative who has indicated his/her understanding and acceptance.   Dental advisory given  Plan Discussed with: CRNA and Surgeon  Anesthesia Plan Comments: (Plan routine monitors, GETA)       Anesthesia Quick Evaluation

## 2013-01-13 NOTE — Op Note (Signed)
01/13/2013  5:03 PM  PATIENT:  Sean Aguirre  37 y.o. male  Patient Care Team: Shelly Rubenstein, MD as PCP - General (General Surgery)  PRE-OPERATIVE DIAGNOSIS:  anal fistula   POST-OPERATIVE DIAGNOSIS:  anal fistula   PROCEDURE:  Procedure(s): EXAM UNDER ANESTHESIA  PLACEMENT OF SETON  SURGEON:  Surgeon(s): Romie Levee, MD  ASSISTANT: none   ANESTHESIA:   local and IV sedation  EBL: min  Total I/O In: 700 [I.V.:700] Out: -   Delay start of Pharmacological VTE agent (>24hrs) due to surgical blood loss or risk of bleeding:  no  DRAINS: vessel loop seton   SPECIMEN:  No Specimen  DISPOSITION OF SPECIMEN:  PATHOLOGY  COUNTS:  YES  PLAN OF CARE: Discharge to home after PACU  PATIENT DISPOSITION:  PACU - hemodynamically stable.  INDICATION: this is a 37 year old male who is status post perianal fistula. He underwent a distal plug placement with Dr. Toy Cookey. His fistula recurred. I then performed a mucosal advancement flap. His fistula is now recurred again. He is here today for seton placement.  OR FINDINGS: left anterior perianal fistula, internal opening just above the mucosal advancement flap  DESCRIPTION: The patient was identified in the preoperative holding area and taken to the OR where they were laid prone on the operating room table in jackknife position. MAC anesthesia was smoothly induced.  The patient was then prepped and draped in the usual sterile fashion. A surgical timeout was performed indicating the correct patient, procedure, positioning and preoperative antibiotics. SCDs were noted to be in place and functioning prior to the operation.  I began by performing a rectal block using approximately 20 mL of Marcaine with epinephrine. I used a Hill-Ferguson anoscope to evaluate the entire anal canal. Mucosal appearance the flap appeared to be intact extending above the dentate line. There was a small internal opening noted just above the mucosal advancement  flap. A S-shaped fistula probe was inserted into the previous abscess site and was able to be advanced into the internal opening. There appeared to be a significant portion of external sphincter involved. The patient did have good sphincter below this bowel. I did inject hydrogen peroxide into the external opening to make sure that there were no other fistula tracts. I did not see any. The fistula tract was then irrigated using normal saline.  I elected to place a seton. An Ethibond suture was used to pull through the fistula tract. I then pulled the vessel loop back through the fistula tract using the suture. The vessel loop was secured in a circular fashion using the Ethibond suture in 3 places. Lidocaine ointment was then applied.  A sterile dressing was applied over this. The patient was awakened from anesthesia and sent to the postanesthesia care unit in stable condition.  All counts were correct per operating room staff.

## 2013-01-16 ENCOUNTER — Telehealth (INDEPENDENT_AMBULATORY_CARE_PROVIDER_SITE_OTHER): Payer: Self-pay | Admitting: General Surgery

## 2013-01-16 ENCOUNTER — Encounter (HOSPITAL_BASED_OUTPATIENT_CLINIC_OR_DEPARTMENT_OTHER): Payer: Self-pay | Admitting: General Surgery

## 2013-01-16 NOTE — Anesthesia Postprocedure Evaluation (Signed)
Anesthesia Post Note  Patient: Sean Aguirre  Procedure(s) Performed: Procedure(s) (LRB): EXAM UNDER ANESTHESIA WITH possible  ANAL FISTULoTOMY  (N/A) PLACEMENT OF SETON (N/A)  Anesthesia type: MAC  Patient location: PACU  Post pain: Pain level controlled  Post assessment: Post-op Vital signs reviewed  Last Vitals: BP 103/66  Pulse 58  Temp(Src) 36.1 C (Oral)  Resp 16  Ht 5\' 11"  (1.803 m)  Wt 184 lb (83.462 kg)  BMI 25.67 kg/m2  SpO2 100%  Post vital signs: Reviewed  Level of consciousness: awake  Complications: No apparent anesthesia complications

## 2013-01-16 NOTE — Telephone Encounter (Signed)
Spoke with pt and informed him of his appt w/ Dr. Maisie Fus on 01/30/13 at 4:20

## 2013-01-30 ENCOUNTER — Encounter (INDEPENDENT_AMBULATORY_CARE_PROVIDER_SITE_OTHER): Payer: Self-pay | Admitting: General Surgery

## 2013-01-30 ENCOUNTER — Ambulatory Visit (INDEPENDENT_AMBULATORY_CARE_PROVIDER_SITE_OTHER): Payer: Managed Care, Other (non HMO) | Admitting: General Surgery

## 2013-01-30 VITALS — BP 118/62 | HR 76 | Resp 14 | Ht 71.0 in | Wt 182.4 lb

## 2013-01-30 DIAGNOSIS — K603 Anal fistula, unspecified: Secondary | ICD-10-CM

## 2013-01-30 NOTE — Patient Instructions (Signed)
Return to office in 4 weeks

## 2013-01-30 NOTE — Progress Notes (Signed)
Sean Aguirre is a 37 y.o. male who 2 weeks status post a seton placement for recurrent anal fistula.  He is having some pain and drainage, but this is getting better.    Objective: Filed Vitals:   01/30/13 1650  BP: 118/62  Pulse: 76  Resp: 14    General appearance: alert and cooperative  Incision: no fluctuant masses   Assessment: s/p  Patient Active Problem List   Diagnosis Date Noted  . Biliary dyskinesia 04/28/2012  . Intra-abdominal abscess post-procedure 03/22/2012  . 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  We will decide at that time fistulotomy vs conservative treatment.      Vanita Panda, MD [facility], [place] [phone]   01/30/2013 5:23 PM

## 2013-03-02 ENCOUNTER — Encounter (INDEPENDENT_AMBULATORY_CARE_PROVIDER_SITE_OTHER): Payer: Self-pay | Admitting: General Surgery

## 2013-03-02 ENCOUNTER — Ambulatory Visit (INDEPENDENT_AMBULATORY_CARE_PROVIDER_SITE_OTHER): Payer: Managed Care, Other (non HMO) | Admitting: General Surgery

## 2013-03-02 VITALS — BP 98/60 | HR 76 | Resp 20 | Ht 70.0 in | Wt 182.6 lb

## 2013-03-02 DIAGNOSIS — K603 Anal fistula: Secondary | ICD-10-CM

## 2013-03-02 NOTE — Progress Notes (Signed)
Sean Aguirre is a 37 y.o. 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  He is s/p seton placement on 7/18.  We have discussed all surgical options in the past.    Objective: Filed Vitals:   03/02/13 1510  BP: 98/60  Pulse: 76  Resp: 20    General appearance: alert and cooperative GI: normal findings: soft, non-tender Anal: long sphincter complex, seton involving only small amt of muscle (~10%)  Assessment and Plan: Sean Aguirre is a 37 y.o. M who is s/p Seton placement 6 weeks ago.  He would like to proceed with fistulotomy.  I think this is reasonable.  All questions were answered.  We discussed the small risk of incontinence.  Other risks include bleeding, pain and recurrence (<2%).    Marland KitchenVanita Panda, MD [facility], [place] [phone]

## 2013-03-02 NOTE — Patient Instructions (Signed)
We will plan on performing a fistulotomy.  Plan on being out of work for ~2 wks

## 2013-03-15 ENCOUNTER — Encounter (HOSPITAL_BASED_OUTPATIENT_CLINIC_OR_DEPARTMENT_OTHER): Payer: Self-pay | Admitting: *Deleted

## 2013-03-15 NOTE — Progress Notes (Signed)
NPO AFTER MN. ARRIVES AT 0715. NEEDS HG. PRE-OP ORDERS PENDING.

## 2013-03-23 ENCOUNTER — Ambulatory Visit (HOSPITAL_BASED_OUTPATIENT_CLINIC_OR_DEPARTMENT_OTHER)
Admission: RE | Admit: 2013-03-23 | Discharge: 2013-03-23 | Disposition: A | Discharge status: Home / Self Care | Payer: Managed Care, Other (non HMO) | Source: Ambulatory Visit | Attending: General Surgery | Admitting: General Surgery

## 2013-03-23 ENCOUNTER — Ambulatory Visit (HOSPITAL_BASED_OUTPATIENT_CLINIC_OR_DEPARTMENT_OTHER): Payer: Managed Care, Other (non HMO) | Admitting: Anesthesiology

## 2013-03-23 ENCOUNTER — Encounter (HOSPITAL_BASED_OUTPATIENT_CLINIC_OR_DEPARTMENT_OTHER)
Admission: RE | Disposition: A | Discharge status: Home / Self Care | Payer: Self-pay | Source: Ambulatory Visit | Attending: General Surgery

## 2013-03-23 ENCOUNTER — Encounter (HOSPITAL_BASED_OUTPATIENT_CLINIC_OR_DEPARTMENT_OTHER): Payer: Self-pay | Admitting: Anesthesiology

## 2013-03-23 ENCOUNTER — Encounter (HOSPITAL_BASED_OUTPATIENT_CLINIC_OR_DEPARTMENT_OTHER): Payer: Self-pay | Admitting: *Deleted

## 2013-03-23 DIAGNOSIS — K603 Anal fistula, unspecified: Secondary | ICD-10-CM | POA: Insufficient documentation

## 2013-03-23 HISTORY — PX: RECTAL EXAM UNDER ANESTHESIA: SHX6399

## 2013-03-23 HISTORY — PX: ANAL FISTULOTOMY: SHX6423

## 2013-03-23 LAB — POCT HEMOGLOBIN-HEMACUE: Hemoglobin: 14.9 g/dL (ref 13.0–17.0)

## 2013-03-23 SURGERY — ANAL FISTULOTOMY
Anesthesia: Monitor Anesthesia Care | Site: Rectum | Wound class: Clean Contaminated

## 2013-03-23 MED ORDER — PROMETHAZINE HCL 25 MG/ML IJ SOLN
6.2500 mg | INTRAMUSCULAR | Status: DC | PRN
  Filled 2013-03-23: qty 1

## 2013-03-23 MED ORDER — LACTATED RINGERS IV SOLN
INTRAVENOUS | Status: DC
  Administered 2013-03-23 (×2): via INTRAVENOUS
  Filled 2013-03-23: qty 1000

## 2013-03-23 MED ORDER — DOCUSATE SODIUM POWD: 1.0000 | Freq: Two times a day (BID) | Status: DC

## 2013-03-23 MED ORDER — ACETAMINOPHEN 650 MG RE SUPP
650.0000 mg | RECTAL | Status: DC | PRN
  Filled 2013-03-23: qty 1

## 2013-03-23 MED ORDER — DEXAMETHASONE SODIUM PHOSPHATE 4 MG/ML IJ SOLN
INTRAMUSCULAR | Status: DC | PRN
  Administered 2013-03-23: 8 mg via INTRAVENOUS

## 2013-03-23 MED ORDER — SODIUM CHLORIDE 0.9 % IJ SOLN
3.0000 mL | INTRAMUSCULAR | Status: DC | PRN
  Filled 2013-03-23: qty 3

## 2013-03-23 MED ORDER — OXYCODONE HCL 5 MG/5ML PO SOLN
5.0000 mg | Freq: Once | ORAL | Status: DC | PRN
  Filled 2013-03-23: qty 5

## 2013-03-23 MED ORDER — ONDANSETRON HCL 4 MG/2ML IJ SOLN
4.0000 mg | Freq: Four times a day (QID) | INTRAMUSCULAR | Status: DC | PRN
  Filled 2013-03-23: qty 2

## 2013-03-23 MED ORDER — OXYCODONE-ACETAMINOPHEN 5-325 MG PO TABS: 1.0000 | ORAL_TABLET | ORAL | Status: DC | PRN

## 2013-03-23 MED ORDER — LACTATED RINGERS IV SOLN
INTRAVENOUS | Status: DC | PRN
  Administered 2013-03-23: 08:00:00 via INTRAVENOUS

## 2013-03-23 MED ORDER — OXYCODONE-ACETAMINOPHEN 5-325 MG PO TABS
1.0000 | ORAL_TABLET | ORAL | Status: DC | PRN
  Administered 2013-03-23: 1 via ORAL
  Filled 2013-03-23: qty 1

## 2013-03-23 MED ORDER — SODIUM CHLORIDE 0.9 % IV SOLN
250.0000 mL | INTRAVENOUS | Status: DC | PRN
  Filled 2013-03-23: qty 250

## 2013-03-23 MED ORDER — KETOROLAC TROMETHAMINE 30 MG/ML IJ SOLN
INTRAMUSCULAR | Status: DC | PRN
  Administered 2013-03-23: 30 mg via INTRAVENOUS

## 2013-03-23 MED ORDER — SODIUM CHLORIDE 0.9 % IR SOLN
Status: DC | PRN
  Administered 2013-03-23: 500 mL

## 2013-03-23 MED ORDER — ACETAMINOPHEN 325 MG PO TABS
650.0000 mg | ORAL_TABLET | ORAL | Status: DC | PRN
  Filled 2013-03-23: qty 2

## 2013-03-23 MED ORDER — SODIUM CHLORIDE 0.9 % IJ SOLN
3.0000 mL | Freq: Two times a day (BID) | INTRAMUSCULAR | Status: DC
  Filled 2013-03-23: qty 3

## 2013-03-23 MED ORDER — BUPIVACAINE-EPINEPHRINE 0.5% -1:200000 IJ SOLN
INTRAMUSCULAR | Status: DC | PRN
  Administered 2013-03-23: 28 mL

## 2013-03-23 MED ORDER — OXYCODONE HCL 5 MG PO TABS
5.0000 mg | ORAL_TABLET | Freq: Once | ORAL | Status: DC | PRN
  Filled 2013-03-23: qty 1

## 2013-03-23 MED ORDER — LIDOCAINE 5 % EX OINT
TOPICAL_OINTMENT | CUTANEOUS | Status: DC | PRN
  Administered 2013-03-23: 1

## 2013-03-23 MED ORDER — ONDANSETRON HCL 4 MG/2ML IJ SOLN
INTRAMUSCULAR | Status: DC | PRN
  Administered 2013-03-23: 4 mg via INTRAVENOUS

## 2013-03-23 MED ORDER — HYDROMORPHONE HCL PF 1 MG/ML IJ SOLN
0.2500 mg | INTRAMUSCULAR | Status: DC | PRN
  Filled 2013-03-23: qty 1

## 2013-03-23 MED ORDER — PROPOFOL 10 MG/ML IV EMUL
INTRAVENOUS | Status: DC | PRN
  Administered 2013-03-23: 75 ug/kg/min via INTRAVENOUS

## 2013-03-23 MED ORDER — FENTANYL CITRATE 0.05 MG/ML IJ SOLN
INTRAMUSCULAR | Status: DC | PRN
  Administered 2013-03-23 (×12): 12.5 ug via INTRAVENOUS

## 2013-03-23 MED ORDER — MIDAZOLAM HCL 5 MG/5ML IJ SOLN
INTRAMUSCULAR | Status: DC | PRN
  Administered 2013-03-23 (×4): 1 mg via INTRAVENOUS

## 2013-03-23 MED ORDER — MEPERIDINE HCL 25 MG/ML IJ SOLN
6.2500 mg | INTRAMUSCULAR | Status: DC | PRN
  Filled 2013-03-23: qty 1

## 2013-03-23 SURGICAL SUPPLY — 48 items
BENZOIN TINCTURE PRP APPL 2/3 (GAUZE/BANDAGES/DRESSINGS) ×3 IMPLANT
BLADE HEX COATED 2.75 (ELECTRODE) ×3 IMPLANT
BLADE SURG 10 STRL SS (BLADE) IMPLANT
BLADE SURG 15 STRL LF DISP TIS (BLADE) ×2 IMPLANT
BLADE SURG 15 STRL SS (BLADE) ×1
BRIEF STRETCH FOR OB PAD LRG (UNDERPADS AND DIAPERS) ×6 IMPLANT
CANISTER SUCTION 2500CC (MISCELLANEOUS) ×3 IMPLANT
CLOTH BEACON ORANGE TIMEOUT ST (SAFETY) ×3 IMPLANT
COVER MAYO STAND STRL (DRAPES) ×3 IMPLANT
COVER TABLE BACK 60X90 (DRAPES) ×3 IMPLANT
DECANTER SPIKE VIAL GLASS SM (MISCELLANEOUS) IMPLANT
DRAPE LG THREE QUARTER DISP (DRAPES) ×6 IMPLANT
DRAPE PED LAPAROTOMY (DRAPES) ×3 IMPLANT
DRSG PAD ABDOMINAL 8X10 ST (GAUZE/BANDAGES/DRESSINGS) ×3 IMPLANT
ELECT BLADE 6.5 .24CM SHAFT (ELECTRODE) IMPLANT
ELECT REM PT RETURN 9FT ADLT (ELECTROSURGICAL) ×3
ELECTRODE REM PT RTRN 9FT ADLT (ELECTROSURGICAL) ×2 IMPLANT
GAUZE SPONGE 4X4 12PLY STRL LF (GAUZE/BANDAGES/DRESSINGS) ×3 IMPLANT
GAUZE SPONGE 4X4 16PLY XRAY LF (GAUZE/BANDAGES/DRESSINGS) IMPLANT
GAUZE VASELINE 3X9 (GAUZE/BANDAGES/DRESSINGS) IMPLANT
GLOVE BIO SURGEON STRL SZ 6 (GLOVE) ×3 IMPLANT
GLOVE BIO SURGEON STRL SZ 6.5 (GLOVE) ×6 IMPLANT
GLOVE BIO SURGEON STRL SZ7 (GLOVE) ×3 IMPLANT
GLOVE BIOGEL PI IND STRL 7.0 (GLOVE) ×4 IMPLANT
GLOVE BIOGEL PI INDICATOR 7.0 (GLOVE) ×2
GLOVE ECLIPSE 6.5 STRL STRAW (GLOVE) ×3 IMPLANT
GLOVE INDICATOR 7.0 STRL GRN (GLOVE) ×6 IMPLANT
GOWN PREVENTION PLUS LG XLONG (DISPOSABLE) ×3 IMPLANT
GOWN PREVENTION PLUS XXLARGE (GOWN DISPOSABLE) ×3 IMPLANT
NDL SAFETY ECLIPSE 18X1.5 (NEEDLE) IMPLANT
NEEDLE HYPO 18GX1.5 SHARP (NEEDLE)
NEEDLE HYPO 25X1 1.5 SAFETY (NEEDLE) ×3 IMPLANT
NS IRRIG 500ML POUR BTL (IV SOLUTION) ×3 IMPLANT
PACK BASIN DAY SURGERY FS (CUSTOM PROCEDURE TRAY) ×3 IMPLANT
PENCIL BUTTON HOLSTER BLD 10FT (ELECTRODE) ×3 IMPLANT
SPONGE GAUZE 4X4 12PLY (GAUZE/BANDAGES/DRESSINGS) IMPLANT
SPONGE SURGIFOAM ABS GEL 12-7 (HEMOSTASIS) IMPLANT
SUT CHROMIC 2 0 SH (SUTURE) IMPLANT
SUT CHROMIC 3 0 SH 27 (SUTURE) IMPLANT
SUT MON AB 3-0 SH 27 (SUTURE) ×1
SUT MON AB 3-0 SH27 (SUTURE) ×2 IMPLANT
SUT VIC AB 4-0 P-3 18XBRD (SUTURE) IMPLANT
SUT VIC AB 4-0 P3 18 (SUTURE)
SYR CONTROL 10ML LL (SYRINGE) ×3 IMPLANT
TOWEL OR 17X24 6PK STRL BLUE (TOWEL DISPOSABLE) ×3 IMPLANT
TRAY DSU PREP LF (CUSTOM PROCEDURE TRAY) ×3 IMPLANT
TUBE CONNECTING 12X1/4 (SUCTIONS) ×3 IMPLANT
YANKAUER SUCT BULB TIP NO VENT (SUCTIONS) ×3 IMPLANT

## 2013-03-23 NOTE — Op Note (Signed)
03/23/2013  9:17 AM  PATIENT:  Marko Stai  37 y.o. male  Patient has no care team.  PRE-OPERATIVE DIAGNOSIS:  anal fistula  POST-OPERATIVE DIAGNOSIS:  anal fistula  PROCEDURE:  Procedure(s): ANAL FISTULOTOMY RECTAL EXAM UNDER ANESTHESIA  SURGEON:  Surgeon(s): Romie Levee, MD  ASSISTANT: none   ANESTHESIA:   local and IV sedation  EBL:  Total I/O In: 100 [I.V.:100] Out: -   DRAINS: none   SPECIMEN:  No Specimen  DISPOSITION OF SPECIMEN:  PATHOLOGY  COUNTS:  YES  PLAN OF CARE: Discharge to home after PACU  PATIENT DISPOSITION:  PACU - hemodynamically stable.  INDICATION: this is a 37 year old male who has had an anal fistula for approximately one year.  He is status post a recurrence after fistula plug as well as a late recurrence after mucosal advancement flap. He is now 3 months status post seton placement. He is here today for fistulotomy. He has elected to undergo the risk associated with fistulotomy to forego the need for additional operative procedures.   OR FINDINGS: anal fistula encompassing much of the external sphincter but minimal internal sphincter.  DESCRIPTION: the patient was identified in the preoperative holding area taken to the OR where he was laid prone on the operating room table. MAC anesthesia was introduced. The patient was prepped and draped in usual sterile fashion. A surgical timeout was performed and again the correct patient, procedure, positioning and need for preoperative antibiotics. SCDs were also noted to be in place prior to the initiation of anesthesia. Once this was completed a digital rectal exam was performed. There were no abnormal masses or lesions. A rectal block was performed using half percent Marcaine with epinephrine. A fistula probe was inserted into the fistula tract and the seton was removed. The fistulotomy was performed using electric artery. The artery was used at the base of the fistula to control hemostasis. After this  was completed the edges were marsupialized using 3-0 chromic running suture. I attempted to tack the external sphincter down to the fistula tract as I went. After this was completed a sterile dressing was applied. The patient was awakened from anesthesia and sent to the post anesthesia care he in stable condition. All counts were correct per operating room staff.

## 2013-03-23 NOTE — Anesthesia Postprocedure Evaluation (Signed)
Anesthesia Post Note  Patient: Sean Aguirre  Procedure(s) Performed: Procedure(s) (LRB): ANAL FISTULOTOMY (N/A) RECTAL EXAM UNDER ANESTHESIA (N/A)  Anesthesia type: MAC  Patient location: PACU  Post pain: Pain level controlled  Post assessment: Post-op Vital signs reviewed  Last Vitals: BP 108/66  Pulse 70  Temp(Src) 36.4 C (Oral)  Resp 6  Ht 5\' 10"  (1.778 m)  Wt 180 lb 8 oz (81.874 kg)  BMI 25.9 kg/m2  SpO2 98%  Post vital signs: Reviewed  Level of consciousness: awake  Complications: No apparent anesthesia complications

## 2013-03-23 NOTE — Anesthesia Preprocedure Evaluation (Addendum)
Anesthesia Evaluation  Patient identified by MRN, date of birth, ID band Patient awake    Reviewed: Allergy & Precautions, H&P , NPO status , Patient's Chart, lab work & pertinent test results  History of Anesthesia Complications Negative for: history of anesthetic complications  Airway Mallampati: I TM Distance: >3 FB Neck ROM: Full    Dental no notable dental hx. (+) Teeth Intact and Dental Advisory Given   Pulmonary  breath sounds clear to auscultation  Pulmonary exam normal       Cardiovascular negative cardio ROS  Rhythm:Regular Rate:Normal     Neuro/Psych negative neurological ROS  negative psych ROS   GI/Hepatic negative GI ROS, Neg liver ROS,   Endo/Other  negative endocrine ROS  Renal/GU negative Renal ROS     Musculoskeletal   Abdominal   Peds  Hematology   Anesthesia Other Findings   Reproductive/Obstetrics                           Anesthesia Physical  Anesthesia Plan  ASA: I  Anesthesia Plan: MAC   Post-op Pain Management:    Induction: Intravenous  Airway Management Planned:   Additional Equipment:   Intra-op Plan:   Post-operative Plan:   Informed Consent: I have reviewed the patients History and Physical, chart, labs and discussed the procedure including the risks, benefits and alternatives for the proposed anesthesia with the patient or authorized representative who has indicated his/her understanding and acceptance.   Dental advisory given  Plan Discussed with: CRNA  Anesthesia Plan Comments:        Anesthesia Quick Evaluation

## 2013-03-23 NOTE — H&P (Signed)
Sean Aguirre is a 37 y.o. 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 He has failed a fistula plug and a MAF.  He is s/p repeat seton placement on 7/18. We have discussed all surgical options in the past.   Past Medical History  Diagnosis Date  . Rectal pain   . Perianal fistula   . Diarrheal stools     SECONDARY TO TAKING FLAGYL   Past Surgical History  Procedure Laterality Date  . Wisdom tooth extraction    . Laparoscopic appendectomy  03/15/2012    Procedure: APPENDECTOMY LAPAROSCOPIC;  Surgeon: Shelly Rubenstein, MD;  Location: [facility];  Service: General;  Laterality: N/A;  . Cholecystectomy  05/02/2012    Procedure: LAPAROSCOPIC CHOLECYSTECTOMY;  Surgeon: Shelly Rubenstein, MD;  Location: [facility];  Service: General;  Laterality: N/A;  . Anal fistulectomy  06/07/2012    Procedure: FISTULECTOMY ANAL;  Surgeon: Romie Levee, MD;  Location: [facility]  Service: General;  Laterality: N/A;   mucosal advancement flap  . Placement of seton  04-19-2012  . Fistula plug  08-12-2011  . Evaluation under anesthesia with anal fistulectomy N/A 01/13/2013    Procedure: EXAM UNDER ANESTHESIA WITH possible  ANAL FISTULoTOMY ;  Surgeon: Romie Levee, MD;  Location: [facility];  Service: General;  Laterality: N/A;  . Placement of seton N/A 01/13/2013    Procedure: PLACEMENT OF SETON;  Surgeon: Romie Levee, MD;  Location: [facility];  Service: General;  Laterality: N/A;   No current facility-administered medications on file prior to encounter.   Current Outpatient Prescriptions on File Prior to Encounter  Medication Sig Dispense Refill  . acetaminophen (TYLENOL) 500 MG tablet Take 1,000 mg by mouth every 6 (six) hours as needed. For pain      . aspirin-acetaminophen-caffeine (EXCEDRIN MIGRAINE) 250-250-65 MG per tablet Take 1 tablet by mouth every 6 (six) hours as needed for pain.      Marland Kitchen ibuprofen (ADVIL,MOTRIN) 200 MG tablet  Take 400 mg by mouth every 6 (six) hours as needed. pain      . lidocaine (XYLOCAINE) 5 % ointment Apply topically as needed.  35.44 g  0  . Multiple Vitamin (MULTIVITAMIN) tablet Take 1 tablet by mouth daily.       Allergies  Allergen Reactions  . Morphine And Related Rash    Shaking chills IV MORPHINE//  PER PT TOLERATES PO VICODIN AND DILAUDID   . Penicillins Other (See Comments)    UNKNOWN   History reviewed. No pertinent family history. History   Social History  . Marital Status: Married    Spouse Name: N/A    Number of Children: N/A  . Years of Education: N/A   Occupational History  . Not on file.   Social History Main Topics  . Smoking status: Former Smoker -- 23 years    Types: Cigars    Quit date: 08/05/2006  . Smokeless tobacco: Former Neurosurgeon    Types: Chew    Quit date: 11/28/2010  . Alcohol Use: 1.2 oz/week    2 Cans of beer per week  . Drug Use: No  . Sexual Activity: Not on file   Other Topics Concern  . Not on file   Social History Narrative  . No narrative on file   Review of Systems  Constitutional: Negative for fever and chills.  HENT: Negative for sore throat.   Respiratory: Negative for cough and shortness of breath.  Cardiovascular: Negative for chest pain.  Gastrointestinal: Negative for nausea, vomiting and abdominal pain.  Genitourinary: Negative for dysuria.  Musculoskeletal: Negative for myalgias.  Skin: Negative for rash.    Objective:  Filed Vitals:   03/23/13 0738  BP: 119/77  Pulse: 70  Temp: 97.3 F (36.3 C)  Resp: 18    General appearance: alert and cooperative  CV: RRR Lings: CTA GI: normal findings: soft, non-tender  Previous Anal Exam: long sphincter complex, seton involving only small amt of muscle (~10%)   Assessment and Plan:  Sean Aguirre is a 37 y.o. M who is s/p Seton placement 6 weeks ago. He would like to proceed with fistulotomy. I think this is reasonable. All questions were answered. We discussed the  small risk of incontinence. Other risks include bleeding, pain and recurrence (<2%).

## 2013-03-23 NOTE — Transfer of Care (Signed)
Immediate Anesthesia Transfer of Care Note  Patient: Sean Aguirre  Procedure(s) Performed: Procedure(s) (LRB): ANAL FISTULOTOMY (N/A) RECTAL EXAM UNDER ANESTHESIA (N/A)  Patient Location: PACU  Anesthesia Type: General  Level of Consciousness: awake, sedated, patient cooperative and responds to stimulation  Airway & Oxygen Therapy: Patient Spontanous Breathing and Patient connected to face mask oxygen  Post-op Assessment: Report given to PACU RN, Post -op Vital signs reviewed and stable and Patient moving all extremities  Post vital signs: Reviewed and stable  Complications: No apparent anesthesia complications

## 2013-03-24 ENCOUNTER — Encounter (HOSPITAL_BASED_OUTPATIENT_CLINIC_OR_DEPARTMENT_OTHER): Payer: Self-pay | Admitting: General Surgery

## 2013-04-12 ENCOUNTER — Encounter (INDEPENDENT_AMBULATORY_CARE_PROVIDER_SITE_OTHER): Payer: Self-pay | Admitting: General Surgery

## 2013-04-12 ENCOUNTER — Ambulatory Visit (INDEPENDENT_AMBULATORY_CARE_PROVIDER_SITE_OTHER): Payer: Managed Care, Other (non HMO) | Admitting: General Surgery

## 2013-04-12 VITALS — BP 110/70 | HR 79 | Temp 97.3°F | Ht 71.0 in | Wt 183.0 lb

## 2013-04-12 DIAGNOSIS — Z9889 Other specified postprocedural states: Secondary | ICD-10-CM

## 2013-04-12 NOTE — Progress Notes (Signed)
Sean Aguirre is a 37 y.o. male who is status post a fistulotomy on 9/25.  His pain is better.  He denies drainage.  He does have occasional inability to control flatus.  He denies any bleeding.  Objective: Filed Vitals:   04/12/13 1546  BP: 110/70  Pulse: 79  Temp: 97.3 F (36.3 C)    General appearance: alert and cooperative GI: normal findings: soft, non-tender  Incision: healing well, small area of granulation tissue and distal edge of incision   Assessment: s/p  Patient Active Problem List   Diagnosis Date Noted  . Biliary dyskinesia 04/28/2012  . Intra-abdominal abscess post-procedure 03/22/2012  . 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  He will call the office if any new symptoms develop.      Vanita Panda, MD [facility], [place] [phone]   04/12/2013 3:56 PM

## 2013-04-12 NOTE — Patient Instructions (Signed)
Continue current treatment.  Call the office if you develop worsening pain, drainage or bleeding.

## 2014-01-29 ENCOUNTER — Encounter: Payer: Self-pay | Admitting: Nurse Practitioner

## 2014-01-29 ENCOUNTER — Ambulatory Visit (INDEPENDENT_AMBULATORY_CARE_PROVIDER_SITE_OTHER): Payer: Managed Care, Other (non HMO) | Admitting: Nurse Practitioner

## 2014-01-29 VITALS — BP 110/70 | HR 66 | Temp 98.5°F | Resp 18 | Ht 70.0 in | Wt 193.0 lb

## 2014-01-29 DIAGNOSIS — R519 Headache, unspecified: Secondary | ICD-10-CM | POA: Insufficient documentation

## 2014-01-29 DIAGNOSIS — R51 Headache: Secondary | ICD-10-CM

## 2014-01-29 DIAGNOSIS — J0100 Acute maxillary sinusitis, unspecified: Secondary | ICD-10-CM | POA: Insufficient documentation

## 2014-01-29 DIAGNOSIS — J029 Acute pharyngitis, unspecified: Secondary | ICD-10-CM

## 2014-01-29 LAB — CBC WITH DIFFERENTIAL/PLATELET
BASOS ABS: 0 10*3/uL (ref 0.0–0.1)
Basophils Relative: 0.4 % (ref 0.0–3.0)
EOS ABS: 0.1 10*3/uL (ref 0.0–0.7)
Eosinophils Relative: 1.6 % (ref 0.0–5.0)
HCT: 43.8 % (ref 39.0–52.0)
Hemoglobin: 14.7 g/dL (ref 13.0–17.0)
Lymphocytes Relative: 31.9 % (ref 12.0–46.0)
Lymphs Abs: 2 10*3/uL (ref 0.7–4.0)
MCHC: 33.6 g/dL (ref 30.0–36.0)
MCV: 82.2 fl (ref 78.0–100.0)
MONO ABS: 0.4 10*3/uL (ref 0.1–1.0)
Monocytes Relative: 6.1 % (ref 3.0–12.0)
NEUTROS ABS: 3.8 10*3/uL (ref 1.4–7.7)
Neutrophils Relative %: 60 % (ref 43.0–77.0)
Platelets: 228 10*3/uL (ref 150.0–400.0)
RBC: 5.32 Mil/uL (ref 4.22–5.81)
RDW: 14.3 % (ref 11.5–15.5)
WBC: 6.4 10*3/uL (ref 4.0–10.5)

## 2014-01-29 LAB — TSH: TSH: 0.66 u[IU]/mL (ref 0.35–4.50)

## 2014-01-29 LAB — COMPREHENSIVE METABOLIC PANEL
ALT: 24 U/L (ref 0–53)
AST: 24 U/L (ref 0–37)
Albumin: 4.3 g/dL (ref 3.5–5.2)
Alkaline Phosphatase: 52 U/L (ref 39–117)
BUN: 15 mg/dL (ref 6–23)
CO2: 28 mEq/L (ref 19–32)
Calcium: 9.2 mg/dL (ref 8.4–10.5)
Chloride: 103 mEq/L (ref 96–112)
Creatinine, Ser: 1.1 mg/dL (ref 0.4–1.5)
GFR: 79.54 mL/min (ref >60.00)
Glucose, Bld: 101 mg/dL — ABNORMAL HIGH (ref 70–99)
Potassium: 4.7 mEq/L (ref 3.5–5.1)
Sodium: 137 mEq/L (ref 135–145)
Total Bilirubin: 1 mg/dL (ref 0.2–1.2)
Total Protein: 7.3 g/dL (ref 6.0–8.3)

## 2014-01-29 LAB — T4, FREE: FREE T4: 0.99 ng/dL (ref 0.60–1.60)

## 2014-01-29 MED ORDER — DOXYCYCLINE HYCLATE 100 MG PO TABS: 100.0000 mg | ORAL_TABLET | Freq: Two times a day (BID) | ORAL | Status: DC

## 2014-01-29 NOTE — Progress Notes (Signed)
Pre visit review using our clinic review tool, if applicable. No additional management support is needed unless otherwise documented below in the visit note. 

## 2014-01-29 NOTE — Patient Instructions (Signed)
I think your symptoms can be explained by sinusitis. Start antibiotic. This antibiotic can cause you to sunburn easily, so take precaution. Don't eat dairy products within 2 hours of taking medicine. Eat yogurt daily at lunch or afternoon to help prevent diarrhea that can be caused by antibiotic. Start daily sinus rinses (Neilmed Sinus rinse) for at least 5-7 days. You may use pseudoephedrine 30 mg twice daily for 4-6 days. Return in 2 weeks for re-evaluation or sooner if you feel worse or develop new symptoms.    Sinusitis Sinusitis is redness, soreness, and swelling (inflammation) of the paranasal sinuses. Paranasal sinuses are air pockets within the bones of your face (beneath the eyes, the middle of the forehead, or above the eyes). In healthy paranasal sinuses, mucus is able to drain out, and air is able to circulate through them by way of your nose. However, when your paranasal sinuses are inflamed, mucus and air can become trapped. This can allow bacteria and other germs to grow and cause infection. Sinusitis can develop quickly and last only a short time (acute) or continue over a long period (chronic). Sinusitis that lasts for more than 12 weeks is considered chronic.  CAUSES  Causes of sinusitis include:  Allergies.  Structural abnormalities, such as displacement of the cartilage that separates your nostrils (deviated septum), which can decrease the air flow through your nose and sinuses and affect sinus drainage.  Functional abnormalities, such as when the small hairs (cilia) that line your sinuses and help remove mucus do not work properly or are not present. SYMPTOMS  Symptoms of acute and chronic sinusitis are the same. The primary symptoms are pain and pressure around the affected sinuses. Other symptoms include:  Upper toothache.  Earache.  Headache.  Bad breath.  Decreased sense of smell and taste.  A cough, which worsens when you are lying  flat.  Fatigue.  Fever.  Thick drainage from your nose, which often is green and may contain pus (purulent).  Swelling and warmth over the affected sinuses. DIAGNOSIS  Your caregiver will perform a physical exam. During the exam, your caregiver may:  Look in your nose for signs of abnormal growths in your nostrils (nasal polyps).  Tap over the affected sinus to check for signs of infection.  View the inside of your sinuses (endoscopy) with a special imaging device with a light attached (endoscope), which is inserted into your sinuses. If your caregiver suspects that you have chronic sinusitis, one or more of the following tests may be recommended:  Allergy tests.  Nasal culture A sample of mucus is taken from your nose and sent to a lab and screened for bacteria.  Nasal cytology A sample of mucus is taken from your nose and examined by your caregiver to determine if your sinusitis is related to an allergy. TREATMENT  Most cases of acute sinusitis are related to a viral infection and will resolve on their own within 10 days. Sometimes medicines are prescribed to help relieve symptoms (pain medicine, decongestants, nasal steroid sprays, or saline sprays).  However, for sinusitis related to a bacterial infection, your caregiver will prescribe antibiotic medicines. These are medicines that will help kill the bacteria causing the infection.  Rarely, sinusitis is caused by a fungal infection. In theses cases, your caregiver will prescribe antifungal medicine. For some cases of chronic sinusitis, surgery is needed. Generally, these are cases in which sinusitis recurs more than 3 times per year, despite other treatments. HOME CARE INSTRUCTIONS   Drink  plenty of water. Water helps thin the mucus so your sinuses can drain more easily.  Use a humidifier.  Inhale steam 3 to 4 times a day (for example, sit in the bathroom with the shower running).  Apply a warm, moist washcloth to your face 3  to 4 times a day, or as directed by your caregiver.  Use saline nasal sprays to help moisten and clean your sinuses.  Take over-the-counter or prescription medicines for pain, discomfort, or fever only as directed by your caregiver. SEEK IMMEDIATE MEDICAL CARE IF:  You have increasing pain or severe headaches.  You have nausea, vomiting, or drowsiness.  You have swelling around your face.  You have vision problems.  You have a stiff neck.  You have difficulty breathing. MAKE SURE YOU:   Understand these instructions.  Will watch your condition.  Will get help right away if you are not doing well or get worse. Document Released: 06/15/2005 Document Revised: 09/07/2011 Document Reviewed: 06/30/2011 [facility] Patient Information 2014 [place], [place].

## 2014-01-29 NOTE — Progress Notes (Signed)
Subjective:     Sean Aguirre is a 38 y.o. male and is here to establish care. The patient reports problems - Daily HA for "several weeks", relieved by tylenol or ibuprophen, fatigue, ear stuffiness, throat discomfort, few episodes dizzyness, occasional asymptomatic flutter" in chest.  HA is across top of head & forehead. Symptoms were proceeded by "cold" about 5 weeks ago. He denies cough, vision change, abdominal pain, nausea, fever, joint pain, and rash.  History   Social History  . Marital Status: Married    Spouse Name: N/A    Number of Children: N/A  . Years of Education: N/A   Occupational History  . Not on file.   Social History Main Topics  . Smoking status: Former Smoker -- 23 years    Types: Cigars    Quit date: 08/05/2006  . Smokeless tobacco: Former NeurosurgeonUser    Types: Chew    Quit date: 11/28/2010  . Alcohol Use: 1.2 oz/week    2 Cans of beer per week  . Drug Use: No  . Sexual Activity: Not on file   Other Topics Concern  . Not on file   Social History Narrative  . No narrative on file   Health Maintenance  Topic Date Due  . Tetanus/tdap  10/31/1992  . Influenza Vaccine  01/27/2014    The following portions of the patient's history were reviewed and updated as appropriate: allergies, current medications, past medical history, past social history, past surgical history and problem list.  Review of Systems Pertinent items are noted in HPI.   Objective:    BP 110/70  Pulse 66  Temp(Src) 98.5 F (36.9 C) (Temporal)  Resp 18  Ht 5\' 10"  (1.778 m)  Wt 193 lb (87.544 kg)  BMI 27.69 kg/m2  SpO2 96% General appearance: alert, cooperative, appears stated age and no distress Head: Normocephalic, without obvious abnormality, atraumatic Eyes: negative findings: lids and lashes normal, conjunctivae and sclerae normal, corneas clear and pupils equal, round, reactive to light and accomodation Ears: normal TM's and external ear canals both ears Nose: Nares  normal. Septum midline. Mucosa normal. No drainage or sinus tenderness. Throat: lips, mucosa, and tongue normal; teeth and gums normal Lungs: clear to auscultation bilaterally Heart: regular rate and rhythm, S1, S2 normal, no murmur, click, rub or gallop Lymph nodes: Cervical adenopathy: anterior nodes tender, slightly enlarged.    Assessment:   1. Acute maxillary sinusitis, recurrence not specified - doxycycline (VIBRA-TABS) 100 MG tablet; Take 1 tablet (100 mg total) by mouth 2 (two) times daily.  Dispense: 14 tablet; Refill: 0 Daily sinus rinses. - CBC with Differential - Comprehensive metabolic panel  2. Sore throat - TSH - T4, free  3. Headache(784.0) - Rocky mtn spotted fvr abs pnl(IgG+IgM)  See pt instructions. F/u 2 weeks.

## 2014-01-30 LAB — ROCKY MTN SPOTTED FVR ABS PNL(IGG+IGM)
RMSF IGM: 0.09 IV
RMSF IgG: 0.27 IV

## 2014-02-01 ENCOUNTER — Telehealth: Payer: Self-pay | Admitting: Nurse Practitioner

## 2014-02-01 NOTE — Telephone Encounter (Signed)
LMOVM for pt to return call concerning results. 

## 2014-02-01 NOTE — Telephone Encounter (Signed)
Patient notified of results. Patient stated his ears feel the same, no better. Patient also stated that his throat feels worse then it did at ov.

## 2014-02-01 NOTE — Telephone Encounter (Signed)
pls call pt: Advise All labs normal . Ask if feeling better.

## 2014-02-12 ENCOUNTER — Encounter: Payer: Self-pay | Admitting: Nurse Practitioner

## 2014-02-12 ENCOUNTER — Ambulatory Visit (INDEPENDENT_AMBULATORY_CARE_PROVIDER_SITE_OTHER): Payer: Managed Care, Other (non HMO) | Admitting: Nurse Practitioner

## 2014-02-12 VITALS — BP 106/67 | HR 75 | Temp 97.2°F | Ht 70.0 in | Wt 192.0 lb

## 2014-02-12 DIAGNOSIS — R51 Headache: Secondary | ICD-10-CM

## 2014-02-12 DIAGNOSIS — J029 Acute pharyngitis, unspecified: Secondary | ICD-10-CM

## 2014-02-12 MED ORDER — DOXYCYCLINE HYCLATE 100 MG PO TABS: 100.0000 mg | ORAL_TABLET | Freq: Two times a day (BID) | ORAL | Status: AC

## 2014-02-12 NOTE — Patient Instructions (Signed)
I am not sure why you are still having sore throat & headache, but since you had relief of symptoms while on doxycycline, I will prescribe another 10 days.  If no improvement, I will refer you to ear. Nose, & throat or infectious disease specialist.  Please follow up in 3 weeks or sooner if you feel worse or develop fever.

## 2014-02-12 NOTE — Progress Notes (Signed)
Pre visit review using our clinic review tool, if applicable. No additional management support is needed unless otherwise documented below in the visit note. 

## 2014-02-12 NOTE — Progress Notes (Signed)
Subjective:    Sean Aguirre is a 38 y.o. male who presents for follow-up of HA, sore throat & ear pain. He was seen on ofc 2 weeks ago w/same symptoms. I treated him w/7 days doxy to cover for tick fever. He had relief of all symptoms after 5 days Doxy, but all symptoms returned after 2 days of finishing ABX. He is taking tylenol or ibuprophen daily with complete relief of HA pain. Ha always comes back when med effects wear off. Ear pain was bilateral, not limited to R ear only. Feels pressure in ear when sneezes. Throat constantly sore-worse when swallows. No fever, nasal congestion, cough, nausea, diarrhea. No vision disturbance. Of note, his daughter had similar symptoms a few weeks ago, She is better. Last eye exam 3-4 ya. Does not spend most of day on computer or paper work-less than 25%.  Labs: CBC, CMET, TSH, RMSF nml.  The following portions of the patient's history were reviewed and updated as appropriate: allergies, current medications, past family history, past medical history, past social history, past surgical history and problem list.  Review of Systems Pertinent items are noted in HPI.    Objective:    BP 106/67  Pulse 75  Temp(Src) 97.2 F (36.2 C) (Temporal)  Ht 5\' 10"  (1.778 m)  Wt 192 lb (87.091 kg)  BMI 27.55 kg/m2  SpO2 95% General appearance: alert, cooperative, appears stated age and no distress Head: Normocephalic, without obvious abnormality, atraumatic Eyes: negative findings: lids and lashes normal and conjunctivae and sclerae normal Ears: whispy appearance R TM, bones visible, no bulge or retraction Throat: lips, mucosa, and tongue normal; teeth and gums normal and posterior pharynx slightly erythematous, tonsils nml, no exudate. Neck: no adenopathy, no carotid bruit, supple, symmetrical, trachea midline and thyroid not enlarged, symmetric, no tenderness/mass/nodules Lungs: clear to auscultation bilaterally Heart: regular rate and rhythm, S1, S2  normal, no murmur, click, rub or gallop    Assessment:   1. Sore throat - POCT rapid strep A - Upper Respiratory Culture - doxycycline (VIBRA-TABS) 100 MG tablet; Take 1 tablet (100 mg total) by mouth 2 (two) times daily.  Dispense: 20 tablet; Refill: 0  2. Headache(784.0) - doxycycline (VIBRA-TABS) 100 MG tablet; Take 1 tablet (100 mg total) by mouth 2 (two) times daily.  Dispense: 20 tablet; Refill: 0  See problem list for complete A&P See pt instructions. F/u 3wks

## 2014-02-12 NOTE — Assessment & Plan Note (Signed)
Continues to have HA daily, come on with progression of day. Sore throat & ear pain. Relief after 5 days doxy, HA returned after off doxy for 2 days. Start doxy X 10 days. F/u 3 weeks. Continue OTC meds PRN.

## 2014-02-12 NOTE — Assessment & Plan Note (Signed)
Duration for 3- weeks. Had 2 day relief of symptom after 5 days doxycyline.  Still having HA & R ear pain-probably radiating from throat. POC rapid strep-neg. Upper resp culture pending 10 more days doxy. F/u 3 wks.if still symptomatic will refer to ENT. Pt has 20 yr Hx of oral tobacco use. Quit 4 ya.

## 2014-02-15 ENCOUNTER — Telehealth: Payer: Self-pay | Admitting: Nurse Practitioner

## 2014-02-15 LAB — CULTURE, UPPER RESPIRATORY: ORGANISM ID, BACTERIA: NORMAL

## 2014-02-15 NOTE — Telephone Encounter (Signed)
pls call pt: Strep culture neg.  

## 2014-02-15 NOTE — Telephone Encounter (Signed)
Patient notified of results. Patient canceled f/u appt. Patient stated that sore throat symptoms have resolved.

## 2014-03-06 ENCOUNTER — Ambulatory Visit: Payer: Managed Care, Other (non HMO) | Admitting: Nurse Practitioner

## 2014-07-17 ENCOUNTER — Emergency Department (HOSPITAL_BASED_OUTPATIENT_CLINIC_OR_DEPARTMENT_OTHER): Payer: Managed Care, Other (non HMO)

## 2014-07-17 ENCOUNTER — Emergency Department (HOSPITAL_BASED_OUTPATIENT_CLINIC_OR_DEPARTMENT_OTHER)
Admission: EM | Admit: 2014-07-17 | Discharge: 2014-07-17 | Disposition: A | Discharge status: Home / Self Care | Payer: Managed Care, Other (non HMO) | Attending: Emergency Medicine | Admitting: Emergency Medicine

## 2014-07-17 ENCOUNTER — Encounter (HOSPITAL_BASED_OUTPATIENT_CLINIC_OR_DEPARTMENT_OTHER): Payer: Self-pay | Admitting: Emergency Medicine

## 2014-07-17 DIAGNOSIS — Y9389 Activity, other specified: Secondary | ICD-10-CM | POA: Insufficient documentation

## 2014-07-17 DIAGNOSIS — Z880 Allergy status to penicillin: Secondary | ICD-10-CM | POA: Insufficient documentation

## 2014-07-17 DIAGNOSIS — X58XXXA Exposure to other specified factors, initial encounter: Secondary | ICD-10-CM | POA: Diagnosis not present

## 2014-07-17 DIAGNOSIS — Y998 Other external cause status: Secondary | ICD-10-CM | POA: Diagnosis not present

## 2014-07-17 DIAGNOSIS — T1490XA Injury, unspecified, initial encounter: Secondary | ICD-10-CM

## 2014-07-17 DIAGNOSIS — Z87891 Personal history of nicotine dependence: Secondary | ICD-10-CM | POA: Diagnosis not present

## 2014-07-17 DIAGNOSIS — Z792 Long term (current) use of antibiotics: Secondary | ICD-10-CM | POA: Diagnosis not present

## 2014-07-17 DIAGNOSIS — Y9289 Other specified places as the place of occurrence of the external cause: Secondary | ICD-10-CM | POA: Insufficient documentation

## 2014-07-17 DIAGNOSIS — S93402A Sprain of unspecified ligament of left ankle, initial encounter: Secondary | ICD-10-CM | POA: Diagnosis not present

## 2014-07-17 DIAGNOSIS — S99912A Unspecified injury of left ankle, initial encounter: Secondary | ICD-10-CM | POA: Diagnosis present

## 2014-07-17 DIAGNOSIS — Z8719 Personal history of other diseases of the digestive system: Secondary | ICD-10-CM | POA: Insufficient documentation

## 2014-07-17 DIAGNOSIS — J45909 Unspecified asthma, uncomplicated: Secondary | ICD-10-CM | POA: Insufficient documentation

## 2014-07-17 MED ORDER — HYDROCODONE-ACETAMINOPHEN 5-325 MG PO TABS
2.0000 | ORAL_TABLET | Freq: Once | ORAL | Status: AC
  Administered 2014-07-17: 2 via ORAL
  Filled 2014-07-17: qty 2

## 2014-07-17 MED ORDER — HYDROCODONE-ACETAMINOPHEN 5-325 MG PO TABS: 2.0000 | ORAL_TABLET | ORAL | Status: AC | PRN

## 2014-07-17 NOTE — ED Provider Notes (Signed)
CSN: [id number]     Arrival date & time 07/17/14  2129 History  This chart was scribed for Sean PorterMark Jericca Russett, MD by [name], ED Scribe. This patient was seen in room MH07/MH07 and the patient's care was started at 10:45 PM.     Chief Complaint  Patient presents with  . Ankle Injury    Patient is a 39 y.o. male presenting with lower extremity injury. The history is provided by the patient. No language interpreter was used.  Ankle Injury Pertinent negatives include no chest pain, no abdominal pain, no headaches and no shortness of breath.   HPI Comments: Sean CaveSolomon David Rozman Aguirre is a 39 y.o. male who presents to the Emergency Department complaining of right ankle pain onset 2 hours PTA. Pt states that he has has associated joint swelling and slight gait problem. Pt states that he tripped over the dog gate and rolled the ankle. Pt states that he fractured the ankle 25 years prior. Denies any surgeries to the right ankle. PT doesn't report any other symptoms.   Past Medical History  Diagnosis Date  . Rectal pain   . Perianal fistula   . Diarrheal stools     SECONDARY TO TAKING FLAGYL  . Asthma    Past Surgical History  Procedure Laterality Date  . Wisdom tooth extraction    . Laparoscopic appendectomy  03/15/2012    Procedure: APPENDECTOMY LAPAROSCOPIC;  Surgeon: [name], MD;  Location: [facility];  Service: General;  Laterality: N/A;  . Cholecystectomy  05/02/2012    Procedure: LAPAROSCOPIC CHOLECYSTECTOMY;  Surgeon: [name], MD;  Location: [facility];  Service: General;  Laterality: N/A;  . Anal fistulectomy  06/07/2012    Procedure: FISTULECTOMY ANAL;  Surgeon: [name], MD;  [facility] [place];  Service: General;  Laterality: N/A;   mucosal advancement flap  . Placement of seton  04-19-2012  . Fistula plug  08-12-2011  . Evaluation under anesthesia with anal fistulectomy N/A 01/13/2013    Procedure: EXAM UNDER ANESTHESIA WITH possible  ANAL  FISTULoTOMY ;  Surgeon: [name], MD;  [facility] [place];  Service: General;  Laterality: N/A;  . Placement of seton N/A 01/13/2013    Procedure: PLACEMENT OF SETON;  Surgeon: [name], MD;  [facility] [place];  Service: General;  Laterality: N/A;  . Anal fistulotomy N/A 03/23/2013    Procedure: ANAL FISTULOTOMY;  Surgeon: [name], MD;  [facility] [place];  Service: General;  Laterality: N/A;  . Rectal exam under anesthesia N/A 03/23/2013    Procedure: RECTAL EXAM UNDER ANESTHESIA;  Surgeon: [name], MD;  [facility] [place];  Service: General;  Laterality: N/A;  . Appendectomy  2013   History reviewed. No pertinent family history. History  Substance Use Topics  . Smoking status: Former Smoker -- 23 years    Types: Cigars    Quit date: 08/05/2006  . Smokeless tobacco: Former NeurosurgeonUser    Types: Chew    Quit date: 11/28/2010  . Alcohol Use: 1.2 oz/week    2 Cans of beer per week    Review of Systems  Constitutional: Negative for fever, chills, diaphoresis, appetite change and fatigue.  HENT: Negative for mouth sores, sore throat and trouble swallowing.   Eyes: Negative for visual disturbance.  Respiratory: Negative for cough, chest tightness, shortness of breath and wheezing.   Cardiovascular: Negative for chest pain.  Gastrointestinal: Negative for nausea, vomiting, abdominal pain, diarrhea and  abdominal distention.  Endocrine: Negative for polydipsia, polyphagia and polyuria.  Genitourinary: Negative for dysuria, frequency and hematuria.  Musculoskeletal: Positive for joint swelling, arthralgias and gait problem.  Skin: Negative for color change, pallor and rash.  Neurological: Negative for dizziness, syncope, light-headedness and headaches.  Hematological: Does not bruise/bleed easily.  Psychiatric/Behavioral: Negative for behavioral problems and confusion.      Allergies  Morphine and  related and Penicillins  Home Medications   Prior to Admission medications   Medication Sig Start Date End Date Taking? Authorizing Provider  acetaminophen (TYLENOL) 500 MG tablet Take 1,000 mg by mouth every 6 (six) hours as needed. For pain    Historical Provider, MD  aspirin-acetaminophen-caffeine (EXCEDRIN MIGRAINE) [phone] MG per tablet Take 1 tablet by mouth every 6 (six) hours as needed for pain.    Historical Provider, MD  doxycycline (VIBRA-TABS) 100 MG tablet Take 1 tablet (100 mg total) by mouth 2 (two) times daily. 02/12/14   [name], NP  HYDROcodone-acetaminophen (NORCO/VICODIN) 5-325 MG per tablet Take 2 tablets by mouth every 4 (four) hours as needed. 07/17/14   Sean Porter, MD  ibuprofen (ADVIL,MOTRIN) 200 MG tablet Take 400 mg by mouth every 6 (six) hours as needed. pain    Historical Provider, MD  Multiple Vitamin (MULTIVITAMIN) tablet Take 1 tablet by mouth daily.    Historical Provider, MD   BP 121/73 mmHg  Pulse 87  Temp(Src) 98.3 F (36.8 C) (Oral)  Resp 18  Ht  (1.778 m)  Wt 185 lb (83.915 kg)  BMI 26.54 kg/m2  SpO2 96%   Physical Exam  Constitutional: He is oriented to person, place, and time. He appears well-developed and well-nourished. No distress.  HENT:  Head: Normocephalic.  Eyes: Conjunctivae are normal. Pupils are equal, round, and reactive to light. No scleral icterus.  Neck: Normal range of motion. Neck supple. No thyromegaly present.  Cardiovascular: Normal rate and regular rhythm.  Exam reveals no gallop and no friction rub.   No murmur heard. Pulmonary/Chest: Effort normal and breath sounds normal. No respiratory distress. He has no wheezes. He has no rales.  Abdominal: Soft. Bowel sounds are normal. He exhibits no distension. There is no tenderness. There is no rebound.  Musculoskeletal: Normal range of motion.  Non tender over 5ht metatarsal or medial foot, soft tissue swelling over ATF ligament. Non tender over proximal fibula.    Neurological: He is alert and oriented to person, place, and time.  Skin: Skin is warm and dry. No rash noted.  Psychiatric: He has a normal mood and affect. His behavior is normal.    ED Course  Procedures (including critical care time) DIAGNOSTIC STUDIES: Oxygen Saturation is 96% on RA, adequate by my interpretation.    COORDINATION OF CARE: 10:48 PM-Discussed treatment plan with pt at bedside and pt agreed to plan.     Labs Review Labs Reviewed - No data to display  Imaging Review Dg Ankle Complete Right  07/17/2014   CLINICAL DATA:  Twisted ankle.  EXAM: RIGHT ANKLE - COMPLETE 3+ VIEW  COMPARISON:  None.  FINDINGS: Marked soft tissue swelling about the ankle, especially laterally. There is lateral malleolus irregularity which appears corticated/ chronic. No acute fracture or malalignment suspected.  IMPRESSION: Remote appearing lateral malleolus irregularity.  No acute fracture.   Electronically Signed   By: Tiburcio Pea M.D.   On: 07/17/2014 22:11     EKG Interpretation None      MDM   Final diagnoses:  Ankle sprain, left, initial encounter    I personally performed the services described in this documentation, which was scribed in my presence. The recorded information has been reviewed and is accurate.     Sean Porter, MD 07/17/14 2256

## 2014-07-17 NOTE — ED Notes (Signed)
Tripped over doggie gate coming down stairs and hurt right ankle

## 2014-07-17 NOTE — Discharge Instructions (Signed)

## 2014-07-17 NOTE — ED Notes (Signed)
Twisted rt ankle  Increased swelling  Pos pedal pulse

## 2014-07-18 ENCOUNTER — Telehealth: Payer: Self-pay | Admitting: Nurse Practitioner

## 2014-07-18 ENCOUNTER — Other Ambulatory Visit: Payer: Self-pay | Admitting: Nurse Practitioner

## 2014-07-18 ENCOUNTER — Telehealth: Payer: Self-pay

## 2014-07-18 DIAGNOSIS — S93401A Sprain of unspecified ligament of right ankle, initial encounter: Secondary | ICD-10-CM

## 2014-07-18 DIAGNOSIS — S93401D Sprain of unspecified ligament of right ankle, subsequent encounter: Secondary | ICD-10-CM

## 2014-07-18 NOTE — Telephone Encounter (Signed)
Patient Name: Sean DieterSOLOMAN DAVID Aguirre  DOB: 05/14/1976    Initial Comment caller states he was in the ER due to ankle injury - was told to follow up with his dr.    Nurse Assessment  Nurse: Ladona RidgelGaddy, RN, Sunny SchleinFelicia Date/Time Lamount Cohen(Eastern Time): 07/18/2014 3:12:38 PM  Confirm and document reason for call. If symptomatic, describe symptoms. ---PT was in ER for R ankle last night and they want him to follow up with Maximino SarinLayne Weaver, MD. Dx as ankle spraine  Has the patient traveled out of the country within the last 30 days? ---No  Does the patient require triage? ---Yes  Related visit to physician within the last 2 weeks? ---Yes  Does the PT have any chronic conditions? (i.e. diabetes, asthma, etc.) ---No  Did the patient indicate they were pregnant? ---No     Guidelines    Guideline Title Affirmed Question Affirmed Notes  Foot and Ankle Injury Looks like a dislocated joint (very crooked or deformed)    Final Disposition User   Call EMS 911 Now Steely HollowGaddy, RN, Felicia    Comments  toes are not blue and he can wiggle them -ankle is swollen like a baseball - more than yesterday. Went to ER already and they told him to be seen by PCP. Now pain level is 3 (they gave him pain medication that he took 3 hours ago - Vicodin  [facility] - he said the Xray did not show new fracture and they felt it was tissue injury - cannot walk on it and feels it is out of alignment - looks twisted, the R foot is twisted down on outer R side. He can barely Move toes but foot won't move. Severe swelling in ankle but mild in toes   No numbness in toes at all. They gave him crutches and splint and he is keeping ice on it. It is about 10-20% more swollen than when seen. They said follow up with PCP but they did not give time frame. Toes are cooler than other foot. Not paler than toes on other foot. Tan/pink. They are not tingling. called Delaney Meigsamara at [facility] about what to do in this situation - since it is twisted down on R  of foot and toes cool and 10-20% more swollen on ankle, ER told him last night to f/u with PCP (ER didnt give timeframe for f/u). She spoke with Dr. Alben SpittleWeaver who said she would send pt to Orthopedist. PT was conferenced to Delaney Meigsamara so she can make an orthopedic follow up. See above -

## 2014-07-18 NOTE — Telephone Encounter (Signed)
Spoke with pt through the team health nurse. She advised me on the follow up for pt. He was seen in the ED for an ankle sprain and was told to follow up with his PCP. I spoke with Layne verbally and she suggested we put in a referral for him to see an orthopedist. I was connected with the patient. I advised him to return to the ER if his ankle gets worse, numb, or changes in color. I will work on referral. Advised to keep leg elevated and apply ice for comfort. Pt verbalized understanding.

## 2014-07-18 NOTE — Progress Notes (Signed)
No problem, I've copied your note in to his referral.

## 2014-07-19 NOTE — Telephone Encounter (Signed)
FYI

## 2019-09-10 ENCOUNTER — Encounter (HOSPITAL_BASED_OUTPATIENT_CLINIC_OR_DEPARTMENT_OTHER): Payer: Self-pay | Admitting: Emergency Medicine

## 2019-09-10 ENCOUNTER — Emergency Department (HOSPITAL_BASED_OUTPATIENT_CLINIC_OR_DEPARTMENT_OTHER)
Admission: EM | Admit: 2019-09-10 | Discharge: 2019-09-10 | Disposition: A | Discharge status: Home / Self Care | Payer: Commercial Managed Care - PPO | Attending: Emergency Medicine | Admitting: Emergency Medicine

## 2019-09-10 ENCOUNTER — Other Ambulatory Visit: Payer: Self-pay

## 2019-09-10 ENCOUNTER — Emergency Department (HOSPITAL_BASED_OUTPATIENT_CLINIC_OR_DEPARTMENT_OTHER): Payer: Commercial Managed Care - PPO

## 2019-09-10 DIAGNOSIS — Y9389 Activity, other specified: Secondary | ICD-10-CM | POA: Diagnosis not present

## 2019-09-10 DIAGNOSIS — S61412A Laceration without foreign body of left hand, initial encounter: Secondary | ICD-10-CM

## 2019-09-10 DIAGNOSIS — Z87891 Personal history of nicotine dependence: Secondary | ICD-10-CM | POA: Insufficient documentation

## 2019-09-10 DIAGNOSIS — W228XXA Striking against or struck by other objects, initial encounter: Secondary | ICD-10-CM | POA: Insufficient documentation

## 2019-09-10 DIAGNOSIS — S6992XA Unspecified injury of left wrist, hand and finger(s), initial encounter: Secondary | ICD-10-CM | POA: Diagnosis present

## 2019-09-10 DIAGNOSIS — Y929 Unspecified place or not applicable: Secondary | ICD-10-CM | POA: Insufficient documentation

## 2019-09-10 DIAGNOSIS — Z23 Encounter for immunization: Secondary | ICD-10-CM | POA: Diagnosis not present

## 2019-09-10 DIAGNOSIS — J45909 Unspecified asthma, uncomplicated: Secondary | ICD-10-CM | POA: Diagnosis not present

## 2019-09-10 DIAGNOSIS — Y999 Unspecified external cause status: Secondary | ICD-10-CM | POA: Insufficient documentation

## 2019-09-10 MED ORDER — LEVOFLOXACIN 750 MG PO TABS: 750.0000 mg | ORAL_TABLET | Freq: Every day | ORAL | 0 refills | Status: AC

## 2019-09-10 MED ORDER — LIDOCAINE-EPINEPHRINE (PF) 2 %-1:200000 IJ SOLN
10.0000 mL | Freq: Once | INTRAMUSCULAR | Status: AC
  Administered 2019-09-10: 10 mL via INTRADERMAL
  Filled 2019-09-10: qty 10

## 2019-09-10 MED ORDER — TETANUS-DIPHTH-ACELL PERTUSSIS 5-2.5-18.5 LF-MCG/0.5 IM SUSP
0.5000 mL | Freq: Once | INTRAMUSCULAR | Status: AC
  Administered 2019-09-10: 0.5 mL via INTRAMUSCULAR
  Filled 2019-09-10: qty 0.5

## 2019-09-10 MED ORDER — CLINDAMYCIN HCL 150 MG PO CAPS: 300.0000 mg | ORAL_CAPSULE | Freq: Four times a day (QID) | ORAL | 0 refills | Status: AC

## 2019-09-10 NOTE — ED Provider Notes (Signed)
[facility] EMERGENCY DEPARTMENT Provider Note   CSN: [id number] Arrival date & time: 09/10/19  1515     History Chief Complaint  Patient presents with  . Hand Injury    Sean Aguirre is a 44 y.o. male with history of asthma presents for evaluation of acute onset, persistent wound to the dorsum of the left hand that occurred just 1 hour prior to arrival.  He reports that he was using a pressure washer when he was attempting to clean a trash can with his right hand when the water pushed to the trash can out of his left hand and caused his left hand to be caught in the stream of the pressure washer briefly.  He reports that his left hand began to swell significantly and that he began to bleed a great deal from a few lacerations sustained to the dorsum of the left hand.  He reports numbness to the distal left first fingertip.  He has some pain with flexion of the left second and third digits.  He is right-hand dominant.  He is not on any blood thinners.  His tetanus is not up-to-date.  The pressure washer was filled with tap water, no caustic agents or other chemicals per the patient.  The history is provided by the patient.       Past Medical History:  Diagnosis Date  . Asthma   . Diarrheal stools    SECONDARY TO TAKING FLAGYL  . Perianal fistula   . Rectal pain     Patient Active Problem List   Diagnosis Date Noted  . Sore throat 02/12/2014  . Headache(784.0) 01/29/2014  . Acute maxillary sinusitis 01/29/2014  . Biliary dyskinesia 04/28/2012  . Intra-abdominal abscess post-procedure 03/22/2012    Past Surgical History:  Procedure Laterality Date  . ANAL FISTULECTOMY  06/07/2012   Procedure: FISTULECTOMY ANAL;  Surgeon: Romie Levee, MD;  Location: [facility];  Service: General;  Laterality: N/A;   mucosal advancement flap  . ANAL FISTULOTOMY N/A 03/23/2013   Procedure: ANAL FISTULOTOMY;  Surgeon: Romie Levee, MD;  Location: [facility];  Service: General;  Laterality: N/A;  . APPENDECTOMY  2013  . CHOLECYSTECTOMY  05/02/2012   Procedure: LAPAROSCOPIC CHOLECYSTECTOMY;  Surgeon: Shelly Rubenstein, MD;  Location: [facility];  Service: General;  Laterality: N/A;  . EVALUATION UNDER ANESTHESIA WITH ANAL FISTULECTOMY N/A 01/13/2013   Procedure: EXAM UNDER ANESTHESIA WITH possible  ANAL FISTULoTOMY ;  Surgeon: Romie Levee, MD;  [facility];  Service: General;  Laterality: N/A;  . FISTULA PLUG  08-12-2011  . LAPAROSCOPIC APPENDECTOMY  03/15/2012   Procedure: APPENDECTOMY LAPAROSCOPIC;  Surgeon: Shelly Rubenstein, MD;  Location: [facility];  Service: General;  Laterality: N/A;  . PLACEMENT OF SETON  04-19-2012  . PLACEMENT OF SETON N/A 01/13/2013   Procedure: PLACEMENT OF SETON;  Surgeon: Romie Levee, MD;  Location: [facility];  Service: General;  Laterality: N/A;  . RECTAL EXAM UNDER ANESTHESIA N/A 03/23/2013   Procedure: RECTAL EXAM UNDER ANESTHESIA;  Surgeon: Romie Levee, MD;  Location: [facility];  Service: General;  Laterality: N/A;  . WISDOM TOOTH EXTRACTION         No family history on file.  Social History   Tobacco Use  . Smoking status: Former Smoker    Years: 23.00    Types: Cigars    Quit date: 08/05/2006    Years since quitting: 13.1  .  Smokeless tobacco: Former Systems developer    Types: [place] date: 11/28/2010  Substance Use Topics  . Alcohol use: Yes    Alcohol/week: 2.0 standard drinks    Types: 2 Cans of beer per week  . Drug use: No    Home Medications Prior to Admission medications   Medication Sig Start Date End Date Taking? Authorizing Provider  Multiple Vitamin (MULTIVITAMIN) tablet Take 1 tablet by mouth daily.   Yes [provider]  acetaminophen (TYLENOL) 500 MG tablet Take 1,000 mg by mouth every 6 (six) hours as needed. For pain    [provider]  aspirin-acetaminophen-caffeine (EXCEDRIN MIGRAINE) [phone] MG  per tablet Take 1 tablet by mouth every 6 (six) hours as needed for pain.    [provider]  clindamycin (CLEOCIN) 150 MG capsule Take 2 capsules (300 mg total) by mouth 4 (four) times daily for 5 days. 09/10/19 09/15/19  Rodell Perna A, PA-C  doxycycline (VIBRA-TABS) 100 MG tablet Take 1 tablet (100 mg total) by mouth 2 (two) times daily. 02/12/14   Nicky Pugh C, NP  HYDROcodone-acetaminophen (NORCO/VICODIN) 5-325 MG per tablet Take 2 tablets by mouth every 4 (four) hours as needed. 07/17/14   Tanna Furry, MD  ibuprofen (ADVIL,MOTRIN) 200 MG tablet Take 400 mg by mouth every 6 (six) hours as needed. pain    [provider]  levofloxacin (LEVAQUIN) 750 MG tablet Take 1 tablet (750 mg total) by mouth daily for 5 days. 09/10/19 09/15/19  Rodell Perna A, PA-C    Allergies    Morphine and related and Penicillins  Review of Systems   Review of Systems  Constitutional: Negative for chills and fever.  Skin: Positive for wound.  Neurological: Positive for numbness. Negative for weakness.  Hematological: Does not bruise/bleed easily.    Physical Exam Updated Vital Signs BP 116/68 (BP Location: Right Arm)   Pulse 63   Temp 98.3 F (36.8 C) (Oral)   Resp 18   Ht 5\' 10"  (1.778 m)   Wt 88.9 kg   SpO2 96%   BMI 28.12 kg/m   Physical Exam Vitals and nursing note reviewed.  Constitutional:      General: He is not in acute distress.    Appearance: He is well-developed.  HENT:     Head: Normocephalic and atraumatic.  Eyes:     General:        Right eye: No discharge.        Left eye: No discharge.     Conjunctiva/sclera: Conjunctivae normal.  Neck:     Vascular: No JVD.     Trachea: No tracheal deviation.  Cardiovascular:     Rate and Rhythm: Normal rate.     Pulses: Normal pulses.     Comments: 2+ radial pulses bilaterally Pulmonary:     Effort: Pulmonary effort is normal.  Abdominal:     General: There is no distension.  Musculoskeletal:        General:  Tenderness present.     Comments: See below image.  Patient with swelling to the dorsum of the left hand.  He has a 3 cm laceration with another 1 cm laceration more distally.  Bleeding controlled.  5/5 strength of wrist and digits with flexion extension against resistance.  Maximally tender to palpation overlying the left second and third MCP joints  Skin:    General: Skin is warm and dry.     Capillary Refill: Capillary refill takes less than 2 seconds.  Findings: No erythema.  Neurological:     Mental Status: He is alert.     Comments: Sensation intact to light touch of bilateral hands aside from subjectively altered sensation to the distal fingertip of the left second digit.  Psychiatric:        Behavior: Behavior normal.         ED Results / Procedures / Treatments   Labs (all labs ordered are listed, but only abnormal results are displayed) Labs Reviewed - No data to display  EKG None  Radiology DG Hand Complete Left  Result Date: 09/10/2019 CLINICAL DATA:  Pressure washer injury EXAM: LEFT HAND - COMPLETE 3+ VIEW COMPARISON:  None. FINDINGS: There is no evidence of fracture or dislocation. There is no evidence of arthropathy or other focal bone abnormality. Subcutaneous emphysema about the dorsum of the hand. IMPRESSION: No fracture or dislocation of the left hand. Subcutaneous emphysema about the dorsum of the hand. Electronically Signed   By: Lauralyn Primes M.D.   On: 09/10/2019 16:15    Procedures .Marland KitchenLaceration Repair  Date/Time: 09/12/2019 7:24 AM Performed by: Jeanie Sewer, PA-C Authorized by: Jeanie Sewer, PA-C   Consent:    Consent obtained:  Verbal   Consent given by:  Patient   Risks discussed:  Infection, pain, poor cosmetic result, retained foreign body and poor wound healing   Alternatives discussed:  Delayed treatment and no treatment Anesthesia (see MAR for exact dosages):    Anesthesia method:  Local infiltration   Local anesthetic:  Lidocaine  2% WITH epi Laceration details:    Location:  Hand   Hand location:  L hand, dorsum   Length (cm):  3   Depth (mm):  3 Repair type:    Repair type:  Simple Pre-procedure details:    Preparation:  Imaging obtained to evaluate for foreign bodies and patient was prepped and draped in usual sterile fashion Exploration:    Hemostasis achieved with:  Direct pressure   Wound exploration: wound explored through full range of motion     Wound extent: areolar tissue violated     Wound extent: no underlying fracture noted     Contaminated: yes   Treatment:    Area cleansed with:  Betadine and saline   Amount of cleaning:  Extensive   Irrigation solution:  Sterile saline   Irrigation method:  Pressure wash   Visualized foreign bodies/material removed: no   Skin repair:    Repair method:  Sutures   Suture size:  4-0   Suture material:  Prolene   Suture technique:  Simple interrupted   Number of sutures:  2 Approximation:    Approximation:  Close Post-procedure details:    Dressing:  Sterile dressing and tube gauze   Patient tolerance of procedure:  Tolerated well, no immediate complications   (including critical care time)  Medications Ordered in ED Medications  Tdap (BOOSTRIX) injection 0.5 mL (0.5 mLs Intramuscular Given 09/10/19 1709)  lidocaine-EPINEPHrine (XYLOCAINE W/EPI) 2 %-1:200000 (PF) injection 10 mL (10 mLs Intradermal Given by Other 09/10/19 1709)    ED Course  I have reviewed the triage vital signs and the nursing notes.  Pertinent labs & imaging results that were available during my care of the patient were reviewed by me and considered in my medical decision making (see chart for details).    MDM Rules/Calculators/A&P                      Patient presents  for evaluation of hand injury after coming to contact with a pressure washer.  He is afebrile, vital signs are stable.  He is nontoxic in appearance.  He has some subjectively altered sensation to the distal  fingertip of the left second digit but otherwise is neurovascularly intact and compartments are soft.  Radiographs show no acute osseous abnormality but he does have some subcutaneous emphysema about the dorsum of the hand consistent with his injury.  There is some crepitance on palpation of the dorsum of the hand consistent with subcu emphysema.  CONSULT: Spoke with Dr. Arita Miss with plastic surgery/hand surgery.  He advises that the patient likely does not require any exploratory surgery at this time as the pressure washer was filled with just tap water.  He recommends closure of the largest laceration and he will see the patient in the office this week.  The wound was extensively irrigated.  Base of the wound was visualized in a bloodless field.  Patient tolerated repair without difficulty.  His tetanus was updated.  We will discharge him with a short course of antibiotics, has an allergy to penicillins so will cover with clindamycin and Levaquin.  He understands to call Dr. Thomos Lemons office tomorrow to schedule follow-up.  Discussed wound care and he understands that his stitches will need to be removed in 7 days and that he can return to the ED, go to urgent care, or have the stitches removed at Dr. Thomos Lemons office or with his PCP.  Discussed strict ED return precautions. Patient verbalized understanding of and agreement with plan and is safe for discharge home at this time.  Final Clinical Impression(s) / ED Diagnoses Final diagnoses:  Laceration of left hand without foreign body, initial encounter    Rx / DC Orders ED Discharge Orders         Ordered    clindamycin (CLEOCIN) 150 MG capsule  4 times daily     09/10/19 1749    levofloxacin (LEVAQUIN) 750 MG tablet  Daily     09/10/19 1749           Jeanie Sewer, PA-C 09/12/19 9622    Maia Plan, MD 09/13/19 1159

## 2019-09-10 NOTE — Discharge Instructions (Signed)
1. Medications: Please take all of your antibiotics until finished!   Take your antibiotics with food.  Common side effects of antibiotics include nausea, vomiting, abdominal discomfort, and diarrhea. You may help offset some of this with probiotics which you can buy or get in yogurt. Do not eat  or take the probiotics until 2 hours after your antibiotic.   Alternate 600 mg of ibuprofen and [phone] mg of Tylenol every 3 hours as needed for pain. Do not exceed 4000 mg of Tylenol daily.  Take ibuprofen with food to avoid upset stomach issues. 2. Treatment: ice for swelling, keep wound clean with warm soap and water and keep bandage dry, do not submerge in water for 24 hours 3. Follow Up: Call Dr. Thomos Lemons office tomorrow to schedule follow-up for this week.  Them know that you were seen in the emergency department and that I spoke with Dr. Arita Miss and that he wanted you scheduled in the office this week. Your stitches will need to be removed in 7 days.  You can return to the emergency department or go to urgent care or your primary care physician.  Return to the emergency department sooner if any concerning signs or symptoms develop such as high fevers, redness, drainage of pus from the wound, or swelling.   WOUND CARE  Keep area clean and dry for 24 hours. Do not remove bandage, if applied.  After 24 hours, remove bandage and wash wound gently with mild soap and warm water. Reapply a new bandage after cleaning wound, if directed.   Continue daily cleansing with soap and water until stitches/staples are removed.  Do not apply any ointments or creams to the wound while stitches/staples are in place, as this may cause delayed healing. Return if you experience any of the following signs of infection: Swelling, redness, pus drainage, streaking, fever >101.0 F  Return if you experience excessive bleeding that does not stop after 15-20 minutes of constant, firm pressure.

## 2019-09-10 NOTE — ED Triage Notes (Signed)
Pt injured L hand with pressure washer. Swelling noted.

## 2019-09-14 ENCOUNTER — Other Ambulatory Visit: Payer: Self-pay

## 2019-09-14 ENCOUNTER — Ambulatory Visit: Payer: Commercial Managed Care - PPO | Admitting: Plastic Surgery

## 2019-09-14 VITALS — BP 118/74 | HR 69 | Temp 98.3°F | Wt 196.0 lb

## 2019-09-14 DIAGNOSIS — S61412A Laceration without foreign body of left hand, initial encounter: Secondary | ICD-10-CM

## 2019-09-14 NOTE — Progress Notes (Signed)
Referring Provider [name], NP [address] [place],  [place] [address]   CC: No chief complaint on file. Left hand injury  Sean Aguirre II is an 44 y.o. male.  HPI: Patient mentions presents as follow-up from emergency room from a left hand injury.  He was pressure washing this past weekend when it came across the dorsal aspect of his left hand.  He sustained a wound and went to the emergency room.  He had pain and swelling there but no evidence of fracture or tendon laceration.  2 sutures were placed along the laceration he was sent here to follow-up.  He feels like he has had some swelling but not much in the way of increased pain.  Allergies  Allergen Reactions  . Morphine And Related Rash    Shaking chills IV MORPHINE//  PER PT TOLERATES PO VICODIN AND DILAUDID   . Penicillins Other (See Comments)    UNKNOWN    Outpatient Encounter Medications as of 09/14/2019  Medication Sig  . acetaminophen (TYLENOL) 500 MG tablet Take 1,000 mg by mouth every 6 (six) hours as needed. For pain  . aspirin-acetaminophen-caffeine (EXCEDRIN MIGRAINE) 250-250-65 MG per tablet Take 1 tablet by mouth every 6 (six) hours as needed for pain.  . clindamycin (CLEOCIN) 150 MG capsule Take 2 capsules (300 mg total) by mouth 4 (four) times daily for 5 days.  Marland Kitchen doxycycline (VIBRA-TABS) 100 MG tablet Take 1 tablet (100 mg total) by mouth 2 (two) times daily.  Marland Kitchen HYDROcodone-acetaminophen (NORCO/VICODIN) 5-325 MG per tablet Take 2 tablets by mouth every 4 (four) hours as needed.  Marland Kitchen ibuprofen (ADVIL,MOTRIN) 200 MG tablet Take 400 mg by mouth every 6 (six) hours as needed. pain  . levofloxacin (LEVAQUIN) 750 MG tablet Take 1 tablet (750 mg total) by mouth daily for 5 days.  . Multiple Vitamin (MULTIVITAMIN) tablet Take 1 tablet by mouth daily.   No facility-administered encounter medications on file as of 09/14/2019.     Past Medical History:  Diagnosis Date  . Asthma   . Diarrheal stools    SECONDARY TO TAKING FLAGYL  . Perianal fistula   . Rectal pain     Past Surgical History:  Procedure Laterality Date  . ANAL FISTULECTOMY  06/07/2012   Procedure: FISTULECTOMY ANAL;  Surgeon: Romie Levee, MD;  Location: [facility];  Service: General;  Laterality: N/A;   mucosal advancement flap  . ANAL FISTULOTOMY N/A 03/23/2013   Procedure: ANAL FISTULOTOMY;  Surgeon: Romie Levee, MD;  Location: [facility];  Service: General;  Laterality: N/A;  . APPENDECTOMY  2013  . CHOLECYSTECTOMY  05/02/2012   Procedure: LAPAROSCOPIC CHOLECYSTECTOMY;  Surgeon: Shelly Rubenstein, MD;  Location: [facility];  Service: General;  Laterality: N/A;  . EVALUATION UNDER ANESTHESIA WITH ANAL FISTULECTOMY N/A 01/13/2013   Procedure: EXAM UNDER ANESTHESIA WITH possible  ANAL FISTULoTOMY ;  Surgeon: Romie Levee, MD;  Location: [facility];  Service: General;  Laterality: N/A;  . FISTULA PLUG  08-12-2011  . LAPAROSCOPIC APPENDECTOMY  03/15/2012   Procedure: APPENDECTOMY LAPAROSCOPIC;  Surgeon: Shelly Rubenstein, MD;  Location: [facility];  Service: General;  Laterality: N/A;  . PLACEMENT OF SETON  04-19-2012  . PLACEMENT OF SETON N/A 01/13/2013   Procedure: PLACEMENT OF SETON;  Surgeon: Romie Levee, MD;  Location: [facility];  Service: General;  Laterality: N/A;  . RECTAL EXAM UNDER ANESTHESIA N/A 03/23/2013   Procedure: RECTAL EXAM UNDER ANESTHESIA;  Surgeon: Leighton Ruff, MD;  Location: [facility];  Service: General;  Laterality: N/A;  . WISDOM TOOTH EXTRACTION      No family history on file.  Social History   Social History Narrative  . Not on file     Review of Systems General: Denies fevers, chills, weight loss CV: Denies chest pain, shortness of breath, palpitations  Physical Exam Vitals with BMI 09/14/2019 09/10/2019 09/10/2019  Height - - -  Weight 196 lbs - -  BMI 70.62 - -  Systolic 376 - 283  Diastolic 74 - 68    Pulse 69 63 65    General:  No acute distress,  Alert and oriented, Non-Toxic, Normal speech and affect Left hand: Fingers well-perfused normal cap refill palp radial pulse.  Sensation is intact intact throughout with a slight diminished area over the dorsal aspect of his index finger.  He has good flexion extension.  There is mild dorsal swelling.  He has a wound dorsally with some thin areas of necrotic skin.  Total wound is about 3 cm in size and is less than a centimeter wide.  There are 2 Prolene sutures in place.  X-ray was reviewed and showed no fracture.  Assessment/Plan Patient presents with a wound on the dorsal aspect of his hand from a pressure washer that he sustained several days ago.  He is overall doing well and is on antibiotics from the ER.  There is no signs of infection or deeper tissue injury as of today.  We will plan to watch this for another couple weeks and to see how things go.  I recommended antibiotic ointment and a Band-Aid to cover the wound dorsally and I suspect it will heal on its own.  If he develops any erythema or increased swelling in the interim he knows to come back sooner.  Cindra Presume 09/14/2019, 4:03 PM

## 2019-09-28 ENCOUNTER — Ambulatory Visit: Payer: Commercial Managed Care - PPO | Admitting: Plastic Surgery
